# Patient Record
Sex: Female | Born: 1980 | Hispanic: Refuse to answer | Marital: Married | Smoking: Never smoker
Health system: Southern US, Community
[De-identification: ages and names within clinical notes are randomized; demographics above are authoritative.]

## PROBLEM LIST (undated history)

## (undated) DIAGNOSIS — G35 Multiple sclerosis: Secondary | ICD-10-CM

## (undated) DIAGNOSIS — G3781 Myelin oligodendrocyte glycoprotein antibody disease: Secondary | ICD-10-CM

## (undated) HISTORY — PX: OTHER SURGICAL HISTORY: SHX169

## (undated) HISTORY — DX: Myelin oligodendrocyte glycoprotein antibody disease: G37.81

## (undated) HISTORY — DX: Multiple sclerosis: G35

---

## 2020-07-31 ENCOUNTER — Encounter: Payer: Self-pay | Admitting: Neurology

## 2020-08-11 ENCOUNTER — Ambulatory Visit (INDEPENDENT_AMBULATORY_CARE_PROVIDER_SITE_OTHER): Payer: 59 | Admitting: Neurology

## 2020-08-11 ENCOUNTER — Other Ambulatory Visit: Payer: Self-pay

## 2020-08-11 ENCOUNTER — Encounter: Payer: Self-pay | Admitting: Neurology

## 2020-08-11 VITALS — BP 104/72 | HR 112 | Resp 18 | Ht 63.0 in | Wt 129.0 lb

## 2020-08-11 DIAGNOSIS — G43009 Migraine without aura, not intractable, without status migrainosus: Secondary | ICD-10-CM | POA: Diagnosis not present

## 2020-08-11 DIAGNOSIS — G35 Multiple sclerosis: Secondary | ICD-10-CM | POA: Diagnosis not present

## 2020-08-11 NOTE — Patient Instructions (Addendum)
I am glad you have been doing well.  But I would still consider starting a disease modifying therapy medication.  Consider: Ponvory Zeposia Mavenclad Kesimpta (this is an injection but only once a week) Ask your friend his recommendation as well 2.  Will check MRI of brain and cervical spine with and without contrast 3.  Continue vitamin D3 50,000 IU weekly 4.  Follow up in 6 months or sooner if needed.  We have sent a referral to [facility] for your MRI and they will call you directly to schedule your appointment. They are located at [address] [facility]. If you need to contact them directly please call [phone].
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
## 2021-02-16 ENCOUNTER — Ambulatory Visit: Payer: 59 | Admitting: Neurology

## 2021-03-09 ENCOUNTER — Other Ambulatory Visit: Payer: Self-pay | Admitting: Internal Medicine

## 2021-03-09 DIAGNOSIS — G35 Multiple sclerosis: Secondary | ICD-10-CM

## 2021-03-12 ENCOUNTER — Telehealth: Payer: Self-pay | Admitting: Neurology

## 2021-03-12 NOTE — Telephone Encounter (Signed)
ERROR

## 2021-03-13 ENCOUNTER — Other Ambulatory Visit (HOSPITAL_COMMUNITY): Payer: Self-pay | Admitting: Internal Medicine

## 2021-03-13 DIAGNOSIS — G35 Multiple sclerosis: Secondary | ICD-10-CM

## 2021-03-14 ENCOUNTER — Ambulatory Visit (HOSPITAL_COMMUNITY)
Admission: RE | Admit: 2021-03-14 | Discharge: 2021-03-14 | Disposition: A | Discharge status: Home / Self Care | Payer: 59 | Source: Ambulatory Visit | Attending: Internal Medicine | Admitting: Internal Medicine

## 2021-03-14 DIAGNOSIS — G35 Multiple sclerosis: Secondary | ICD-10-CM | POA: Diagnosis present

## 2021-03-14 MED ORDER — GADOBUTROL 1 MMOL/ML IV SOLN
6.0000 mL | Freq: Once | INTRAVENOUS | Status: AC | PRN
  Administered 2021-03-14: 6 mL via INTRAVENOUS

## 2021-03-15 ENCOUNTER — Ambulatory Visit (HOSPITAL_COMMUNITY): Payer: 59

## 2021-03-16 ENCOUNTER — Inpatient Hospital Stay
Admission: RE | Admit: 2021-03-16 | Discharge: 2021-03-16 | Disposition: A | Discharge status: Home / Self Care | Payer: Self-pay | Source: Ambulatory Visit | Attending: Neurology | Admitting: Neurology

## 2021-03-16 ENCOUNTER — Other Ambulatory Visit (HOSPITAL_COMMUNITY): Payer: Self-pay | Admitting: Neurology

## 2021-03-16 DIAGNOSIS — Q232 Congenital mitral stenosis: Secondary | ICD-10-CM

## 2021-03-17 ENCOUNTER — Other Ambulatory Visit: Payer: Self-pay

## 2021-03-17 ENCOUNTER — Inpatient Hospital Stay (HOSPITAL_COMMUNITY)
Admission: EM | Admit: 2021-03-17 | Discharge: 2021-03-22 | DRG: 060 | Disposition: A | Discharge status: Home / Self Care | Payer: 59 | Attending: Family Medicine | Admitting: Family Medicine

## 2021-03-17 DIAGNOSIS — L0293 Carbuncle, unspecified: Secondary | ICD-10-CM | POA: Diagnosis present

## 2021-03-17 DIAGNOSIS — D649 Anemia, unspecified: Secondary | ICD-10-CM | POA: Diagnosis present

## 2021-03-17 DIAGNOSIS — D72829 Elevated white blood cell count, unspecified: Secondary | ICD-10-CM | POA: Diagnosis present

## 2021-03-17 DIAGNOSIS — E559 Vitamin D deficiency, unspecified: Secondary | ICD-10-CM | POA: Diagnosis present

## 2021-03-17 DIAGNOSIS — G43909 Migraine, unspecified, not intractable, without status migrainosus: Secondary | ICD-10-CM | POA: Diagnosis present

## 2021-03-17 DIAGNOSIS — G35 Multiple sclerosis: Principal | ICD-10-CM | POA: Diagnosis present

## 2021-03-17 DIAGNOSIS — S50861A Insect bite (nonvenomous) of right forearm, initial encounter: Secondary | ICD-10-CM | POA: Diagnosis present

## 2021-03-17 DIAGNOSIS — Z20822 Contact with and (suspected) exposure to covid-19: Secondary | ICD-10-CM | POA: Diagnosis present

## 2021-03-17 DIAGNOSIS — Y929 Unspecified place or not applicable: Secondary | ICD-10-CM

## 2021-03-17 DIAGNOSIS — W57XXXA Bitten or stung by nonvenomous insect and other nonvenomous arthropods, initial encounter: Secondary | ICD-10-CM

## 2021-03-17 DIAGNOSIS — H9202 Otalgia, left ear: Secondary | ICD-10-CM | POA: Diagnosis present

## 2021-03-17 LAB — CBC
HCT: 38.7 % (ref 36.0–46.0)
Hemoglobin: 12.6 g/dL (ref 12.0–15.0)
MCH: 30.7 pg (ref 26.0–34.0)
MCHC: 32.6 g/dL (ref 30.0–36.0)
MCV: 94.2 fL (ref 80.0–100.0)
Platelets: 326 10*3/uL (ref 150–400)
RBC: 4.11 MIL/uL (ref 3.87–5.11)
RDW: 13.9 % (ref 11.5–15.5)
WBC: 12.4 10*3/uL — ABNORMAL HIGH (ref 4.0–10.5)
nRBC: 0 % (ref 0.0–0.2)

## 2021-03-17 LAB — URINALYSIS, ROUTINE W REFLEX MICROSCOPIC
Bilirubin Urine: NEGATIVE
Glucose, UA: NEGATIVE mg/dL
Ketones, ur: NEGATIVE mg/dL
Leukocytes,Ua: NEGATIVE
Nitrite: NEGATIVE
Protein, ur: NEGATIVE mg/dL
Specific Gravity, Urine: 1.011 (ref 1.005–1.030)
pH: 7 (ref 5.0–8.0)

## 2021-03-17 LAB — BASIC METABOLIC PANEL WITH GFR
Anion gap: 8 (ref 5–15)
BUN: 10 mg/dL (ref 6–20)
CO2: 26 mmol/L (ref 22–32)
Calcium: 8.9 mg/dL (ref 8.9–10.3)
Chloride: 102 mmol/L (ref 98–111)
Creatinine, Ser: 0.62 mg/dL (ref 0.44–1.00)
GFR, Estimated: >60 mL/min
Glucose, Bld: 101 mg/dL — ABNORMAL HIGH (ref 70–99)
Potassium: 3.7 mmol/L (ref 3.5–5.1)
Sodium: 136 mmol/L (ref 135–145)

## 2021-03-17 LAB — I-STAT BETA HCG BLOOD, ED (MC, WL, AP ONLY): I-stat hCG, quantitative: <5 m[IU]/mL (ref <5)

## 2021-03-17 NOTE — ED Triage Notes (Addendum)
Pt has MS Has not had a flare in 14 years.  Has right sided numbness and tingling.  Had MRI at [facility] and Dr. Leilani Merl called her with results and told her to present to the ED and not wait for her appt that she has tomorrow as she needs treatment sooner.  Pt states she is "dragging her right foot" and cannot grip with her right hand. Finished a prednisone taper on Thursday.

## 2021-03-18 ENCOUNTER — Ambulatory Visit: Payer: 59 | Admitting: Neurology

## 2021-03-18 ENCOUNTER — Observation Stay (HOSPITAL_COMMUNITY): Payer: 59

## 2021-03-18 ENCOUNTER — Encounter (HOSPITAL_COMMUNITY): Payer: Self-pay | Admitting: Internal Medicine

## 2021-03-18 ENCOUNTER — Observation Stay: Payer: Self-pay

## 2021-03-18 DIAGNOSIS — Y929 Unspecified place or not applicable: Secondary | ICD-10-CM | POA: Diagnosis not present

## 2021-03-18 DIAGNOSIS — W57XXXA Bitten or stung by nonvenomous insect and other nonvenomous arthropods, initial encounter: Secondary | ICD-10-CM | POA: Diagnosis not present

## 2021-03-18 DIAGNOSIS — E559 Vitamin D deficiency, unspecified: Secondary | ICD-10-CM | POA: Diagnosis present

## 2021-03-18 DIAGNOSIS — L0293 Carbuncle, unspecified: Secondary | ICD-10-CM | POA: Diagnosis present

## 2021-03-18 DIAGNOSIS — W57XXXD Bitten or stung by nonvenomous insect and other nonvenomous arthropods, subsequent encounter: Secondary | ICD-10-CM | POA: Diagnosis not present

## 2021-03-18 DIAGNOSIS — G35 Multiple sclerosis: Secondary | ICD-10-CM | POA: Diagnosis present

## 2021-03-18 DIAGNOSIS — Z20822 Contact with and (suspected) exposure to covid-19: Secondary | ICD-10-CM | POA: Diagnosis present

## 2021-03-18 DIAGNOSIS — S50861A Insect bite (nonvenomous) of right forearm, initial encounter: Secondary | ICD-10-CM | POA: Diagnosis present

## 2021-03-18 DIAGNOSIS — G43909 Migraine, unspecified, not intractable, without status migrainosus: Secondary | ICD-10-CM | POA: Diagnosis present

## 2021-03-18 DIAGNOSIS — H9202 Otalgia, left ear: Secondary | ICD-10-CM | POA: Diagnosis present

## 2021-03-18 DIAGNOSIS — D649 Anemia, unspecified: Secondary | ICD-10-CM | POA: Diagnosis present

## 2021-03-18 DIAGNOSIS — D72829 Elevated white blood cell count, unspecified: Secondary | ICD-10-CM | POA: Diagnosis present

## 2021-03-18 LAB — BASIC METABOLIC PANEL
Anion gap: 6 (ref 5–15)
BUN: 10 mg/dL (ref 6–20)
CO2: 24 mmol/L (ref 22–32)
Calcium: 8.2 mg/dL — ABNORMAL LOW (ref 8.9–10.3)
Chloride: 106 mmol/L (ref 98–111)
Creatinine, Ser: 0.62 mg/dL (ref 0.44–1.00)
GFR, Estimated: >60 mL/min (ref >60)
Glucose, Bld: 95 mg/dL (ref 70–99)
Potassium: 3.8 mmol/L (ref 3.5–5.1)
Sodium: 136 mmol/L (ref 135–145)

## 2021-03-18 LAB — RESP PANEL BY RT-PCR (FLU A&B, COVID) ARPGX2
Influenza A by PCR: NEGATIVE
Influenza B by PCR: NEGATIVE
SARS Coronavirus 2 by RT PCR: NEGATIVE

## 2021-03-18 LAB — CBG MONITORING, ED: Glucose-Capillary: 100 mg/dL — ABNORMAL HIGH (ref 70–99)

## 2021-03-18 LAB — HIV ANTIBODY (ROUTINE TESTING W REFLEX): HIV Screen 4th Generation wRfx: NONREACTIVE

## 2021-03-18 MED ORDER — SODIUM CHLORIDE 0.9 % IV BOLUS
1000.0000 mL | Freq: Once | INTRAVENOUS | Status: AC
  Administered 2021-03-18: 1000 mL via INTRAVENOUS

## 2021-03-18 MED ORDER — SODIUM CHLORIDE 0.9 % IV SOLN
1000.0000 mg | INTRAVENOUS | Status: AC
  Administered 2021-03-19 – 2021-03-22 (×4): 1000 mg via INTRAVENOUS
  Filled 2021-03-18 (×4): qty 16

## 2021-03-18 MED ORDER — ACETAMINOPHEN 325 MG PO TABS: 650.0000 mg | ORAL_TABLET | Freq: Four times a day (QID) | ORAL | Status: DC | PRN

## 2021-03-18 MED ORDER — ACETAMINOPHEN 650 MG RE SUPP: 650.0000 mg | Freq: Four times a day (QID) | RECTAL | Status: DC | PRN

## 2021-03-18 MED ORDER — ONDANSETRON HCL 4 MG/2ML IJ SOLN: 4.0000 mg | Freq: Four times a day (QID) | INTRAMUSCULAR | Status: DC | PRN

## 2021-03-18 MED ORDER — GADOBUTROL 1 MMOL/ML IV SOLN
6.0000 mL | Freq: Once | INTRAVENOUS | Status: AC | PRN
  Administered 2021-03-18: 6 mL via INTRAVENOUS

## 2021-03-18 MED ORDER — DOXYCYCLINE HYCLATE 100 MG PO TABS: 100.0000 mg | ORAL_TABLET | Freq: Two times a day (BID) | ORAL | Status: DC

## 2021-03-18 MED ORDER — DIPHENHYDRAMINE HCL 25 MG PO CAPS: 25.0000 mg | ORAL_CAPSULE | Freq: Every evening | ORAL | Status: DC | PRN

## 2021-03-18 MED ORDER — ENOXAPARIN SODIUM 40 MG/0.4ML IJ SOSY
40.0000 mg | PREFILLED_SYRINGE | INTRAMUSCULAR | Status: DC
  Administered 2021-03-18 – 2021-03-21 (×4): 40 mg via SUBCUTANEOUS
  Filled 2021-03-18 (×4): qty 0.4

## 2021-03-18 MED ORDER — SODIUM CHLORIDE 0.9 % IV SOLN
1000.0000 mg | Freq: Once | INTRAVENOUS | Status: AC
  Administered 2021-03-18: 1000 mg via INTRAVENOUS
  Filled 2021-03-18: qty 16

## 2021-03-18 MED ORDER — DOXYCYCLINE HYCLATE 100 MG PO TABS
100.0000 mg | ORAL_TABLET | Freq: Two times a day (BID) | ORAL | Status: DC
  Administered 2021-03-18 – 2021-03-21 (×8): 100 mg via ORAL
  Filled 2021-03-18 (×8): qty 1

## 2021-03-18 MED ORDER — ONDANSETRON HCL 4 MG PO TABS: 4.0000 mg | ORAL_TABLET | Freq: Four times a day (QID) | ORAL | Status: DC | PRN

## 2021-03-18 MED ORDER — OXYCODONE-ACETAMINOPHEN 5-325 MG PO TABS
2.0000 | ORAL_TABLET | Freq: Once | ORAL | Status: AC
  Administered 2021-03-18: 2 via ORAL
  Filled 2021-03-18: qty 2

## 2021-03-18 MED ORDER — IBUPROFEN 200 MG PO TABS
600.0000 mg | ORAL_TABLET | Freq: Four times a day (QID) | ORAL | Status: DC | PRN
  Administered 2021-03-19: 16:00:00 600 mg via ORAL
  Filled 2021-03-18: qty 3

## 2021-03-18 NOTE — ED Notes (Signed)
Ambulating with PT

## 2021-03-18 NOTE — H&P (Signed)
History and Physical    Janice Rose QQI:[id number] DOB: 1980-06-19 DOA: 03/17/2021  PCP: Lucianne Lei, MD  Patient coming from: Home  I have personally briefly reviewed patient's old medical records in [facility]  Chief Complaint: MS flare  HPI: Janice Rose is a 40 y.o. female with medical history significant of MS, in remission for 15 years now.  MS history: initial diagnosis 2005, initial treatment with IV solumedrol followed by prednisone taper.  Started on Copaxone.  Relapse around 2007.  Also treated with steroids.  No further relapses since that time.  Copaxone stopped in 2010, off DMT since then.  Pt presents to ED with several weeks of RUE and RLE weakness, numbness, heaviness.  Initially thought this due to bug bite on R forearm which is being treated with doxycycline.  Symptoms progressed, nothing makes better or worse.  Low dose steroids started also not helping as outpt.  Went to Dr. Epimenio Foot with [facility].  He got MRI brain on her 3 days ago which shoed 3cm lesion concerning for MS flare.  ED Course: Pt started on solumedrol.   Review of Systems: As per HPI, otherwise all review of systems negative.  Past Medical History:  Diagnosis Date   MS (multiple sclerosis) (HCC)     Past Surgical History:  Procedure Laterality Date   none       reports that she has never smoked. She has never used smokeless tobacco. She reports current alcohol use. No history on file for drug use.  No Known Allergies  Family History  Problem Relation Age of Onset   Multiple sclerosis Neg Hx      Prior to Admission medications   Medication Sig Start Date End Date Taking? Authorizing Provider  doxycycline (VIBRAMYCIN) 100 MG capsule Take 100 mg by mouth See admin instructions. 2 caps on day 1, then 1 capsule daily x 7 days 03/13/21  Yes [provider]  ibuprofen (ADVIL) 200 MG tablet Take 600 mg by mouth every 6 (six) hours as needed for moderate pain or  headache.   Yes [provider]    Physical Exam: Vitals:   03/17/21 2222 03/18/21 0142 03/18/21 0312 03/18/21 0400  BP: 100/74 104/84 99/69 128/82  Pulse: 94 90 72 81  Resp: 16 16 20 20   Temp:      TempSrc:      SpO2: 97% 99% 100% 100%    Constitutional: NAD, calm, comfortable Eyes: PERRL, lids and conjunctivae normal ENMT: Mucous membranes are moist. Posterior pharynx clear of any exudate or lesions.Normal dentition.  Neck: normal, supple, no masses, no thyromegaly Respiratory: clear to auscultation bilaterally, no wheezing, no crackles. Normal respiratory effort. No accessory muscle use.  Cardiovascular: Regular rate and rhythm, no murmurs / rubs / gallops. No extremity edema. 2+ pedal pulses. No carotid bruits.  Abdomen: no tenderness, no masses palpated. No hepatosplenomegaly. Bowel sounds positive.  Musculoskeletal: no clubbing / cyanosis. No joint deformity upper and lower extremities. Good ROM, no contractures. Normal muscle tone.  Skin: 45mm induration RUE Neurologic: Fine motor impairment of fingers on R.  Strength 5/5 in all 4.  Psychiatric: Normal judgment and insight. Alert and oriented x 3. Normal mood.    Labs on Admission: I have personally reviewed following labs and imaging studies  CBC: Recent Labs  Lab 03/17/21 1610  WBC 12.4*  HGB 12.6  HCT 38.7  MCV 94.2  PLT 326   Basic Metabolic Panel: Recent Labs  Lab 03/17/21 1610  NA 136  K 3.7  CL 102  CO2 26  GLUCOSE 101*  BUN 10  CREATININE 0.62  CALCIUM 8.9   GFR: CrCl cannot be calculated (Unknown ideal weight.). Liver Function Tests: No results for input(s): AST, ALT, ALKPHOS, BILITOT, PROT, ALBUMIN in the last 168 hours. No results for input(s): LIPASE, AMYLASE in the last 168 hours. No results for input(s): AMMONIA in the last 168 hours. Coagulation Profile: No results for input(s): INR, PROTIME in the last 168 hours. Cardiac Enzymes: No results for input(s): CKTOTAL, CKMB,  CKMBINDEX, TROPONINI in the last 168 hours. BNP (last 3 results) No results for input(s): PROBNP in the last 8760 hours. HbA1C: No results for input(s): HGBA1C in the last 72 hours. CBG: No results for input(s): GLUCAP in the last 168 hours. Lipid Profile: No results for input(s): CHOL, HDL, LDLCALC, TRIG, CHOLHDL, LDLDIRECT in the last 72 hours. Thyroid Function Tests: No results for input(s): TSH, T4TOTAL, FREET4, T3FREE, THYROIDAB in the last 72 hours. Anemia Panel: No results for input(s): VITAMINB12, FOLATE, FERRITIN, TIBC, IRON, RETICCTPCT in the last 72 hours. Urine analysis:    Component Value Date/Time   COLORURINE STRAW (A) 03/17/2021 1606   APPEARANCEUR CLEAR 03/17/2021 1606   LABSPEC 1.011 03/17/2021 1606   PHURINE 7.0 03/17/2021 1606   GLUCOSEU NEGATIVE 03/17/2021 1606   HGBUR SMALL (A) 03/17/2021 1606   BILIRUBINUR NEGATIVE 03/17/2021 1606   KETONESUR NEGATIVE 03/17/2021 1606   PROTEINUR NEGATIVE 03/17/2021 1606   NITRITE NEGATIVE 03/17/2021 1606   LEUKOCYTESUR NEGATIVE 03/17/2021 1606    Radiological Exams on Admission: No results found.  EKG: Independently reviewed.  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 Though MRI and history more consistent with MS.  DVT prophylaxis: Lovenox Code Status: Full Family Communication: Family at bedside Disposition Plan: Home after treatment for MS flare Consults called: Neurology Admission status: Place in 68    Justyn Langham M. DO [facility]  How to contact the [facility] Attending or Consulting provider 7A - 7P or covering provider during after hours 7P -7A, for this patient?  Check the care team in [facility] and look for a)  attending/consulting [facility] provider listed and b) the [facility] team listed Log into [email or web address]  Amion Physician Scheduling and messaging for groups and whole hospitals  On call and physician scheduling software for group practices, residents, hospitalists and other medical providers for call, clinic, rotation and shift schedules. OnCall [employer] is a hospital-wide system for scheduling doctors and paging doctors on call. EasyPlot is for scientific plotting and data analysis.  [email or web address]  and use [facility] universal password to access. If you do not have the password, please contact the hospital operator.  Locate the [facility] provider you are looking for under [facility] and page to a number that you can be directly reached. If you still have difficulty reaching the provider, please page the [facility] (Director on Call) for the Hospitalists listed on amion for assistance.  03/18/2021, 4:33 AM

## 2021-03-18 NOTE — ED Provider Notes (Signed)
[facility] EMERGENCY DEPARTMENT Provider Note  CSN: [id number] Arrival date & time: 03/17/21 1505  Chief Complaint(s) Multiple Sclerosis  HPI Janice Rose is a 40 y.o. female with a past medical history listed below including multiple sclerosis who been in remission for over 15 years here for several weeks of right upper and lower extremity numbness and heaviness.  She initially noticed numbness of the right upper extremity with difficulty performing fine motor skills.  She believes it was due to a bug bite on the right forearm which is currently being treated with doxycycline.  The right upper extremity symptoms progressed and she developed right lower extremity heaviness and right facial numbness.  No alleviating or aggravating factors.  Patient establish care with Dr. Epimenio Foot from [facility] neurology and followed up with her primary care doctor who obtain MRI of the brain last week noting a 3 cm lesion concerning for MS flare.  HPI  Past Medical History Past Medical History:  Diagnosis Date   MS (multiple sclerosis) (HCC)    There are no problems to display for this patient.  Home Medication(s) Prior to Admission medications   Medication Sig Start Date End Date Taking? Authorizing Provider  doxycycline (VIBRAMYCIN) 100 MG capsule Take 100 mg by mouth See admin instructions. 2 caps on day 1, then 1 capsule daily x 7 days 03/13/21  Yes [provider]  ibuprofen (ADVIL) 200 MG tablet Take 600 mg by mouth every 6 (six) hours as needed for moderate pain or headache.   Yes [provider]  predniSONE (STERAPRED UNI-PAK 21 TAB) 10 MG (21) TBPK tablet Take 10 mg by mouth See admin instructions. 6,5,4,3,2,1 Patient not taking: Reported on 03/18/2021 03/06/21   [provider]                                                                                                                                    Past Surgical History Past Surgical  History:  Procedure Laterality Date   none     Family History No family history on file.  Social History Social History   Tobacco Use   Smoking status: Never   Smokeless tobacco: Never  Vaping Use   Vaping Use: Never used  Substance Use Topics   Alcohol use: Yes   Allergies Patient has no known allergies.  Review of Systems Review of Systems All other systems are reviewed and are negative for acute change except as noted in the HPI  Physical Exam Vital Signs  I have reviewed the triage vital signs BP 128/82 (BP Location: Right Arm)   Pulse 81   Temp 99.1 F (37.3 C) (Oral)   Resp 20   SpO2 100%   Physical Exam Vitals reviewed.  Constitutional:      General: She is not in acute distress.    Appearance: She is well-developed. She is not diaphoretic.  HENT:  Head: Normocephalic and atraumatic.     Nose: Nose normal.  Eyes:     General: No scleral icterus.       Right eye: No discharge.        Left eye: No discharge.     Conjunctiva/sclera: Conjunctivae normal.     Pupils: Pupils are equal, round, and reactive to light.  Cardiovascular:     Rate and Rhythm: Normal rate and regular rhythm.     Heart sounds: No murmur heard.   No friction rub. No gallop.  Pulmonary:     Effort: Pulmonary effort is normal. No respiratory distress.     Breath sounds: Normal breath sounds. No stridor. No rales.  Abdominal:     General: There is no distension.     Palpations: Abdomen is soft.     Tenderness: There is no abdominal tenderness.  Musculoskeletal:        General: No tenderness.     Cervical back: Normal range of motion and neck supple.  Skin:    General: Skin is warm and dry.     Findings: No rash.       Neurological:     Mental Status: She is alert and oriented to person, place, and time.     Comments: Mental Status:  Alert and oriented to person, place, and time.  Attention and concentration normal.  Speech clear.  Recent memory is intact  Cranial  Nerves:  II Visual Fields: Intact to confrontation. Visual fields intact. III, IV, VI: Pupils equal and reactive to light and near. Full eye movement without nystagmus  V Facial Sensation: Normal. No weakness of masticatory muscles  VII: No facial weakness or asymmetry  VIII Auditory Acuity: Grossly normal  IX/X: The uvula is midline; the palate elevates symmetrically  XI: Normal sternocleidomastoid and trapezius strength  XII: The tongue is midline. No atrophy or fasciculations.   Motor System: Muscle Strength: 5/5 and symmetric in the upper and lower extremities. No pronation or drift.  Muscle Tone: Tone and muscle bulk are normal in the upper and lower extremities.  Reflexes: DTRs: No Clonus Coordination: fast finger touch impaired on right. No tremor.  Sensation: Intact to light touch Gait: deferred     ED Results and Treatments Labs (all labs ordered are listed, but only abnormal results are displayed) Labs Reviewed  BASIC METABOLIC PANEL - Abnormal; Notable for the following components:      Result Value   Glucose, Bld 101 (*)    All other components within normal limits  CBC - Abnormal; Notable for the following components:   WBC 12.4 (*)    All other components within normal limits  URINALYSIS, ROUTINE W REFLEX MICROSCOPIC - Abnormal; Notable for the following components:   Color, Urine STRAW (*)    Hgb urine dipstick SMALL (*)    Bacteria, UA RARE (*)    All other components within normal limits  RESP PANEL BY RT-PCR (FLU A&B, COVID) ARPGX2  I-STAT BETA HCG BLOOD, ED ([facility], [facility], [facility] ONLY)  EKG  EKG Interpretation  Date/Time:    Ventricular Rate:    PR Interval:    QRS Duration:   QT Interval:    QTC Calculation:   R Axis:     Text Interpretation:         Radiology No results found.  Pertinent labs & imaging results that were available  during my care of the patient were reviewed by me and considered in my medical decision making (see MDM for details).  Medications Ordered in ED Medications  sodium chloride 0.9 % bolus 1,000 mL (has no administration in time range)  methylPREDNISolone sodium succinate (SOLU-MEDROL) 1,000 mg in sodium chloride 0.9 % 50 mL IVPB (has no administration in time range)  doxycycline (VIBRA-TABS) tablet 100 mg (has no administration in time range)  oxyCODONE-acetaminophen (PERCOCET/ROXICET) 5-325 MG per tablet 2 tablet (2 tablets Oral Given 03/18/21 0155)                                                                                                                                     Procedures Procedures  (including critical care time)  Medical Decision Making / ED Course I have reviewed the nursing notes for this encounter and the patient's prior records (if available in EHR or on provided paperwork).  Janice Rose was evaluated in Emergency Department on 03/18/2021 for the symptoms described in the history of present illness. She was evaluated in the context of the global COVID-19 pandemic, which necessitated consideration that the patient might be at risk for infection with the SARS-CoV-2 virus that causes COVID-19. Institutional protocols and algorithms that pertain to the evaluation of patients at risk for COVID-19 are in a state of rapid change based on information released by regulatory bodies including the CDC and federal and state organizations. These policies and algorithms were followed during the patient's care in the ED.     MS flare. Just finished low dose steroids. Will need admission for high dose steroids.  Right forearm bug bite. Indurated. <1cm. Will continue Doxy treatment.  Pertinent labs & imaging results that were available during my care of the patient were reviewed by me and considered in my medical decision making:  Neurology consulted who will see. Admit to  medicine.  Final Clinical Impression(s) / ED Diagnoses Final diagnoses:  Multiple sclerosis exacerbation (HCC)  Bug bite with infection, initial encounter     This chart was dictated using voice recognition software.  Despite best efforts to proofread,  errors can occur which can change the documentation meaning.    Nira Conn, MD 03/18/21 [id number]

## 2021-03-18 NOTE — ED Notes (Signed)
Placed in room from MRI

## 2021-03-18 NOTE — Evaluation (Signed)
Physical Therapy Evaluation Patient Details Name: Janice Rose MRN: [id number] DOB: Oct 21, 1980 Today's Date: 03/18/2021  History of Present Illness  40 y/o F presenting on 11/15 after experiencing several weeks RUE and more recent RLE weakness, numbness, and heaviness. Found to have MS flare. PMH includes MS.  Clinical Impression  Pt was seen for mobility on hallway with no AD, and reviewed high level balance testing.  Note her biggest deficits were the control of RLE to land midstance with steps, and the challenge of single leg balance on RLE.  Her plan is to work inpt during the receiving of meds to manage her symptoms of MS, and hopefully will not need further outpatient therapy.  However, plan to order for now pending the completion of her stay given her plan to go home.  Follow acutely for  balance skills, gait training and practice with strengthening for R calf to increase her control of RLE and safety for home.       Recommendations for follow up therapy are one component of a multi-disciplinary discharge planning process, led by the attending physician.  Recommendations may be updated based on patient status, additional functional criteria and insurance authorization.  Follow Up Recommendations Outpatient PT (if needed)    Assistance Recommended at Discharge PRN  Functional Status Assessment Patient has had a recent decline in their functional status and demonstrates the ability to make significant improvements in function in a reasonable and predictable amount of time.  Equipment Recommendations  None recommended by PT    Recommendations for Other Services       Precautions / Restrictions Precautions Precautions: Fall Precaution Comments: RLE mod incoordination Restrictions Weight Bearing Restrictions: No      Mobility  Bed Mobility Overal bed mobility: Independent                  Transfers Overall transfer level: Modified independent                       Ambulation/Gait Ambulation/Gait assistance: Supervision Gait Distance (Feet): 150 Feet Assistive device: None Gait Pattern/deviations: Step-through pattern;Narrow base of support Gait velocity: reduced Gait velocity interpretation: <1.31 ft/sec, indicative of household ambulator Pre-gait activities: standing balance skills General Gait Details: RLE has harder footfall with midstance landing, limited step length on RLE  Stairs            Wheelchair Mobility    Modified Rankin (Stroke Patients Only)       Balance Overall balance assessment: Needs assistance Sitting-balance support: Feet supported Sitting balance-Leahy Scale: Good   Postural control: Right lateral lean Standing balance support: No upper extremity supported Standing balance-Leahy Scale: Fair Standing balance comment: has moments of change in gait but no outright loss with just gait.  Has standing balance test changes                 Standardized Balance Assessment Standardized Balance Assessment : Berg Balance Test Berg Balance Test Sit to Stand: Able to stand without using hands and stabilize independently Standing Unsupported: Able to stand safely 2 minutes Sitting with Back Unsupported but Feet Supported on Floor or Stool: Able to sit safely and securely 2 minutes Stand to Sit: Sits safely with minimal use of hands Transfers: Able to transfer safely, minor use of hands Standing Unsupported with Eyes Closed: Able to stand 10 seconds with supervision Standing Ubsupported with Feet Together: Able to place feet together independently and stand 1 minute safely From Standing, Reach Forward  with Outstretched Arm: Can reach forward >12 cm safely (5") From Standing Position, Pick up Object from Floor: Able to pick up shoe safely and easily From Standing Position, Turn to Look Behind Over each Shoulder: Looks behind from both sides and weight shifts well Turn 360 Degrees: Able to turn 360 degrees  safely in 4 seconds or less Standing Unsupported, Alternately Place Feet on Step/Stool: Able to stand independently and safely and complete 8 steps in 20 seconds Standing Unsupported, One Foot in Front: Able to plae foot ahead of the other independently and hold 30 seconds Standing on One Leg: Tries to lift leg/unable to hold 3 seconds but remains standing independently Total Score: 50         Pertinent Vitals/Pain Pain Assessment: No/denies pain    Home Living Family/patient expects to be discharged to:: Private residence Living Arrangements: Spouse/significant other Available Help at Discharge: Family;Available 24 hours/day Type of Home: Apartment Home Access: Level entry       Home Layout: One level Home Equipment: Agricultural consultant (2 wheels)      Prior Function Prior Level of Function : Independent/Modified Independent;Driving             Mobility Comments: 6 & 40 yo at home       Hand Dominance   Dominant Hand: Right    Extremity/Trunk Assessment   Upper Extremity Assessment Upper Extremity Assessment: Defer to OT evaluation    Lower Extremity Assessment Lower Extremity Assessment: RLE deficits/detail RLE Deficits / Details: weakness in calf and moderate incoordination RLE Coordination: decreased gross motor    Cervical / Trunk Assessment Cervical / Trunk Assessment: Normal  Communication   Communication: No difficulties (reported expressive struggles to OT)  Cognition Arousal/Alertness: Awake/alert Behavior During Therapy: WFL for tasks assessed/performed Overall Cognitive Status: Within Functional Limits for tasks assessed                                          General Comments General comments (skin integrity, edema, etc.): pt has some incoordination on RLE wtih gait changes, mainly noting during single leg stance.  Pt is mainly having issues of coordination but strength is equal on LE's currently    Exercises Other  Exercises Other Exercises: UE and LE coordination   Assessment/Plan    PT Assessment Patient needs continued PT services  PT Problem List Decreased coordination       PT Treatment Interventions DME instruction;Gait training;Stair training;Functional mobility training;Therapeutic activities;Therapeutic exercise;Balance training;Neuromuscular re-education;Patient/family education    PT Goals (Current goals can be found in the Care Plan section)  Acute Rehab PT Goals Patient Stated Goal: to get home and get her tx for MS started PT Goal Formulation: With patient/family Time For Goal Achievement: 03/25/21 Potential to Achieve Goals: Good    Frequency Min 3X/week   Barriers to discharge   has many family members to assist    Co-evaluation               AM-PAC PT "6 Clicks" Mobility  Outcome Measure Help needed turning from your back to your side while in a flat bed without using bedrails?: A Little Help needed moving from lying on your back to sitting on the side of a flat bed without using bedrails?: A Little Help needed moving to and from a bed to a chair (including a wheelchair)?: A Little Help needed standing up from  a chair using your arms (e.g., wheelchair or bedside chair)?: A Little Help needed to walk in hospital room?: A Little Help needed climbing 3-5 steps with a railing? : A Little 6 Click Score: 18    End of Session Equipment Utilized During Treatment: Gait belt Activity Tolerance: Patient tolerated treatment well Patient left: in bed;with family/visitor present;with call bell/phone within reach Nurse Communication: Mobility status PT Visit Diagnosis: Unsteadiness on feet (R26.81);Other abnormalities of gait and mobility (R26.89)    Time: 6967-8938 PT Time Calculation (min) (ACUTE ONLY): 28 min   Charges:   PT Evaluation $PT Eval Moderate Complexity: 1 Mod PT Treatments $Gait Training: 8-22 mins       Ivar Drape 03/18/2021, 5:04 PM  Samul Dada, PT PhD Acute Rehab Dept. Number: [facility] [id number] and [facility] [phone]

## 2021-03-18 NOTE — Evaluation (Signed)
Occupational Therapy Evaluation Patient Details Name: Janice Rose MRN: [id number] DOB: [birth year] Today's Date: 03/18/2021   History of Present Illness Pt is 40 y/o F presenting after experiencing several weeks RUE and RLE weakness, numbness, and heaviness. Found to have MS flare. PMH includes MS.   Clinical Impression   PTA pt lives independently with her husband and 6 & 74 yo children. Pt is able to ambulate and complete ADL tasks, however demonstrates an abnormal gait pattern and deficits with functional use of RUE due to deficits listed below.  Recommend follow up with OT at the neuro outpt center. Will follow acutely.      Recommendations for follow up therapy are one component of a multi-disciplinary discharge planning process, led by the attending physician.  Recommendations may be updated based on patient status, additional functional criteria and insurance authorization.   Follow Up Recommendations  Outpatient OT (neuro outpt)    Assistance Recommended at Discharge None  Functional Status Assessment  Patient has had a recent decline in their functional status and demonstrates the ability to make significant improvements in function in a reasonable and predictable amount of time.  Equipment Recommendations  None recommended by OT    Recommendations for Other Services PT consult     Precautions / Restrictions Precautions Precautions: Fall      Mobility Bed Mobility Overal bed mobility: Independent                  Transfers Overall transfer level: Modified independent    Has been ambulating back and forth to the bathroom; has not had any falls at home                    Balance Overall balance assessment: Mild deficits observed, not formally tested (R foot drags at times); PT to further assess                                         ADL either performed or assessed with clinical judgement   ADL Overall ADL's : Needs  assistance/impaired   Eating/Feeding Details (indicate cue type and reason): using non-dominant hand                                 Functional mobility during ADLs: Modified independent General ADL Comments: Able to complete tasks however difficulty with holding utensils; buttoning, etc     Vision Baseline Vision/History: 0 No visual deficits       Perception     Praxis      Pertinent Vitals/Pain Pain Assessment: No/denies pain     Hand Dominance Right   Extremity/Trunk Assessment Upper Extremity Assessment Upper Extremity Assessment: RUE deficits/detail RUE Deficits / Details: AROM WFL; Strength overall 4/5; decreased in-hand manipulation/fine-motor skills skills; estinguishing use of RUE at times; decreased FNF RUE Sensation: decreased light touch RUE Coordination: decreased fine motor Pt states she was being treated for "carpal tunnel syndrome" by her healthcare provider and had been wearing a brace on her arm; She states she has been "favoring" the arm until she was told that her weakness/sensation deficits were due to her MS   Lower Extremity Assessment Lower Extremity Assessment: Defer to PT evaluation   Cervical / Trunk Assessment Cervical / Trunk Assessment: Normal   Communication Communication Communication: Expressive difficulties (she feels her speech  is different)   Cognition Arousal/Alertness: Awake/alert Behavior During Therapy: WFL for tasks assessed/performed Overall Cognitive Status: Within Functional Limits for tasks assessed                                       General Comments       Exercises Exercises: Other exercises Other Exercises Other Exercises: fine motor/coordination tasks using cups/lids and straws; BUE intergrated tasks   Shoulder Instructions      Home Living Family/patient expects to be discharged to:: Private residence Living Arrangements: Spouse/significant other Available Help at Discharge:  Family;Available 24 hours/day Type of Home: Apartment Home Access: Level entry     Home Layout: One level     Bathroom Shower/Tub: Tub/shower unit;Curtain   Firefighter: Standard Bathroom Accessibility: Yes How Accessible: Accessible via walker Home Equipment: Rolling Walker (2 wheels)          Prior Functioning/Environment Prior Level of Function : Independent/Modified Independent;Driving             Mobility Comments: 6 & 40 yo at home - caretaker for her children          OT Problem List: Decreased strength;Impaired balance (sitting and/or standing);Decreased coordination;Decreased knowledge of use of DME or AE;Impaired sensation;Impaired UE functional use      OT Treatment/Interventions: Self-care/ADL training;Therapeutic exercise;Neuromuscular education;DME and/or AE instruction;Energy conservation;Therapeutic activities;Patient/family education    OT Goals(Current goals can be found in the care plan section) Acute Rehab OT Goals Patient Stated Goal: to get back to normal OT Goal Formulation: With patient Time For Goal Achievement: 04/01/21 Potential to Achieve Goals: Good  OT Frequency: Min 2X/week   Barriers to D/C:            Co-evaluation              AM-PAC OT "6 Clicks" Daily Activity     Outcome Measure Help from another person eating meals?: None Help from another person taking care of personal grooming?: None Help from another person toileting, which includes using toliet, bedpan, or urinal?: None Help from another person bathing (including washing, rinsing, drying)?: None Help from another person to put on and taking off regular upper body clothing?: None Help from another person to put on and taking off regular lower body clothing?: None 6 Click Score: 24   End of Session Nurse Communication: Mobility status;Other (comment) (DC needs)  Activity Tolerance: Patient tolerated treatment well Patient left: in bed;with call bell/phone  within reach;with family/visitor present  OT Visit Diagnosis: Other abnormalities of gait and mobility (R26.89);Hemiplegia and hemiparesis Hemiplegia - Right/Left: Right Hemiplegia - dominant/non-dominant: Dominant Hemiplegia - caused by: Unspecified                Time: 8638-1771 OT Time Calculation (min): 20 min Charges:  OT General Charges $OT Visit: 1 Visit OT Evaluation $OT Eval Moderate Complexity: 1 Mod  Cerena Baine, OT/L   Acute OT Clinical Specialist Acute Rehabilitation Services Pager [phone] Office [phone]   [facility] 03/18/2021, 2:09 PM

## 2021-03-18 NOTE — ED Notes (Signed)
Received verbal report Jonita Albee RN at this time

## 2021-03-18 NOTE — Consult Note (Signed)
NEURO HOSPITALIST CONSULT NOTE   Requesting physician: Dr. Eudelia Bunch  Reason for Consult: MS exacerbation  History obtained from:    Patient and Chart     HPI:                                                                                                                                          Janice Rose is a 40 y.o. female with a known history of multiple sclerosis diagnosed in 2005 who has not had a flare in 14 years, presenting with new onset of right sided numbness and tingling. The new symptoms had been going on for several weeks and included right upper and lower extremity numbness and heaviness. She initially noticed numbness of the right upper extremity with difficulty performing fine motor skills.  She initially believed it was due to a bug bite on the right forearm, which is currently being treated with doxycycline. Her right upper extremity symptoms progressed and she then developed right lower extremity heaviness and right facial numbness. She had an outpatient MRI at [facility] and was then called by her Neurologist, Dr. Epimenio Foot, who informed her of a new enhancing lesion seen on the scan and advised her to proceed to the ED for inpatient steroid treatment. The patient has been dragging her right foot and cannot grip with her right hand. She finished an oral prednisone taper on Thursday; the taper did not result in improvement of her symptoms.    Regarding her prior MS history, she had been on Copaxone, then had a relapse in about 2007 which was treated with steroids. She has had no further relapses since 2007. Copaxone was stopped in 2010 and she has not been on disease modifying treatment since then.   Past Medical History:  Diagnosis Date   MS (multiple sclerosis) (HCC)     Past Surgical History:  Procedure Laterality Date   none      Family History  Problem Relation Age of Onset   Multiple sclerosis Neg Hx             Social History:  reports that  she has never smoked. She has never used smokeless tobacco. She reports current alcohol use. No history on file for drug use.  No Known Allergies  HOME MEDICATIONS:  No current facility-administered medications on file prior to encounter.   Current Outpatient Medications on File Prior to Encounter  Medication Sig Dispense Refill   doxycycline (VIBRAMYCIN) 100 MG capsule Take 100 mg by mouth See admin instructions. 2 caps on day 1, then 1 capsule daily x 7 days     ibuprofen (ADVIL) 200 MG tablet Take 600 mg by mouth every 6 (six) hours as needed for moderate pain or headache.       ROS:                                                                                                                                       As per HPI. Does not endorse any additional neurological complaints.    Blood pressure 128/82, pulse 81, temperature 99.1 F (37.3 C), temperature source Oral, resp. rate 20, height 5\' 3"  (1.6 m), weight 58.5 kg, SpO2 100 %.   General Examination:                                                                                                       Physical Exam  HEENT-  [place]/AT     Lungs- Respirations unlabored Extremities- No edema  Neurological Examination Mental Status: Awake and alert. Pleasant and cooperative. Speech fluent with intact comprehension and naming. Fully oriented. Good insight.  Cranial Nerves: II: Temporal visual fields intact with no extinction to DSS.   III,IV, VI: Tracks and fixates normally. EOMI. No nystagmus.  V,VII: Smile symmetric, facial temp sensation equal bilaterally VIII: Hearing intact to voice IX,X: No hypophonia XI: Subtle lag on the right with shoulder shrug XII: Midline tongue extension Motor: RUE 4+/5. Positive orbiting fingers test on the right.  RLE 4+/5 LUE and LLE 5/5 No pronator drift.  Sensory: Temp and  light touch intact throughout, bilaterally. No asymmetry.  Deep Tendon Reflexes: Mild hyperreflexia on the right.   Plantars: Right: downgoing   Left: downgoing Cerebellar: Subtle ataxia with FNF and toe-tapping on the right.  Gait: Deferred   Lab Results: Basic Metabolic Panel: Recent Labs  Lab 03/17/21 1610  NA 136  K 3.7  CL 102  CO2 26  GLUCOSE 101*  BUN 10  CREATININE 0.62  CALCIUM 8.9    CBC: Recent Labs  Lab 03/17/21 1610  WBC 12.4*  HGB 12.6  HCT 38.7  MCV 94.2  PLT 326    Cardiac Enzymes: No results for input(s): CKTOTAL, CKMB, CKMBINDEX, TROPONINI in the last 168 hours.  Lipid  Panel: No results for input(s): CHOL, TRIG, HDL, CHOLHDL, VLDL, LDLCALC in the last 168 hours.  Imaging: No results found.   Assessment: 40 year old female with known multiple sclerosis, presenting with MS exacerbation 1. Exam reveals mild UMN deficits on the right.  2. MRI brain: Repeat MRI brain is pending.  3. Outside MRI brain: As per HPI.   Recommendations: 1. IV Solumedrol 1000 mg qd x 5 days 2. PT/OT 3. Monitor CBG, WBC and electrolytes while on steroids 4. Most likely will need to restart DMT as outpatient. Copaxone has worked well for her in the past. She had side effects from Rebif. 5. Outpatient follow up with Dr. Epimenio Foot   Electronically signed: Dr. Caryl Pina 03/18/2021, 6:09 AM

## 2021-03-18 NOTE — ED Notes (Signed)
Pt transported to MRI 

## 2021-03-18 NOTE — ED Notes (Signed)
Patient in MRI, has not been placed in room 37.

## 2021-03-18 NOTE — Progress Notes (Signed)
Same day note  Patient seen and examined at bedside.  Patient was admitted to the hospital for MS flare with right upper extremity and lower extremity weakness numbness and heaviness.  At the time of my evaluation, patient complains of decreased fine motor skills.  Physical examination reveals alert awake oriented female at bedside.  Right forearm erythematous rash.  Moves all extremities.  Laboratory data and imaging was reviewed  Assessment and Plan.  Multiple sclerosis flare. Neurology on board.  On high-dose IV steroids at this time.  We will continue.  Had outside MRI of the brain 3 days back which showed possibility of active MS.  Repeat MRI has been ordered this admission.  We will get PT OT evaluation.  Patient follows up with [facility] neurology, Dr. Epimenio Foot as outpatient.  On Copaxone in the past.  Patient has been off DMT patient.  Right forearm bug bite.  On doxycycline from outpatient.  We will continue for the next 7 days.  Spoke with the patient's mother at bedside.  No Charge  Signed,  Tenny Craw, MD [facility]

## 2021-03-19 DIAGNOSIS — S50861A Insect bite (nonvenomous) of right forearm, initial encounter: Secondary | ICD-10-CM

## 2021-03-19 LAB — CBC
HCT: 33.4 % — ABNORMAL LOW (ref 36.0–46.0)
Hemoglobin: 10.8 g/dL — ABNORMAL LOW (ref 12.0–15.0)
MCH: 30.3 pg (ref 26.0–34.0)
MCHC: 32.3 g/dL (ref 30.0–36.0)
MCV: 93.8 fL (ref 80.0–100.0)
Platelets: 279 10*3/uL (ref 150–400)
RBC: 3.56 MIL/uL — ABNORMAL LOW (ref 3.87–5.11)
RDW: 13.8 % (ref 11.5–15.5)
WBC: 16.8 10*3/uL — ABNORMAL HIGH (ref 4.0–10.5)
nRBC: 0 % (ref 0.0–0.2)

## 2021-03-19 LAB — BASIC METABOLIC PANEL
Anion gap: 9 (ref 5–15)
BUN: 6 mg/dL (ref 6–20)
CO2: 21 mmol/L — ABNORMAL LOW (ref 22–32)
Calcium: 9 mg/dL (ref 8.9–10.3)
Chloride: 107 mmol/L (ref 98–111)
Creatinine, Ser: 0.46 mg/dL (ref 0.44–1.00)
GFR, Estimated: >60 mL/min (ref >60)
Glucose, Bld: 171 mg/dL — ABNORMAL HIGH (ref 70–99)
Potassium: 3.7 mmol/L (ref 3.5–5.1)
Sodium: 137 mmol/L (ref 135–145)

## 2021-03-19 LAB — MAGNESIUM: Magnesium: 1.9 mg/dL (ref 1.7–2.4)

## 2021-03-19 NOTE — Plan of Care (Signed)

## 2021-03-19 NOTE — Plan of Care (Signed)
  Problem: Education: Goal: Knowledge of General Education information will improve Description: Including pain rating scale, medication(s)/side effects and non-pharmacologic comfort measures Outcome: Progressing   Problem: Clinical Measurements: Goal: Ability to maintain clinical measurements within normal limits will improve Outcome: Progressing Goal: Diagnostic test results will improve Outcome: Progressing   Problem: Activity: Goal: Risk for activity intolerance will decrease Outcome: Progressing   Problem: Pain Managment: Goal: General experience of comfort will improve Outcome: Progressing   Problem: Safety: Goal: Ability to remain free from injury will improve Outcome: Progressing   

## 2021-03-19 NOTE — Progress Notes (Addendum)
PROGRESS NOTE  Janice Rose LOV:[id number] DOB: August 18, 1980 DOA: 03/17/2021 PCP: Lucianne Lei, MD   LOS: 1 day   Brief narrative:  MICHAELLA Rose is a 40 y.o. female with medical history significant of MS, in remission for 15 years with initial diagnosis in 2005 and relapse in 2007 was on Copaxone in the past which was stopped in 2010.  Patient has been off disease modifying treatment since 2000 and but presented to hospital this time with several weeks of right upper extremity and right lower extremity weakness numbness and heaviness.  Initially this was thought to be a bug bite on the right forearm which was being treated as outpatient by doxycycline.  Symptoms continue to progress all see he went to see Dr. Epimenio Foot with [facility] neurology.  He MRI of the brain was done outpatient which showed 3 cm lesion concerning for MS flare so the patient was referred to the hospital for further evaluation and treatment.  Assessment/Plan:  Principal Problem:   Multiple sclerosis (HCC)   Multiple sclerosis flare. Neurology on board.  On high-dose IV steroids at this time.  Plan is 1 g IV daily for 5 days.   Had outside MRI of the brain 3 days back which showed possibility of active MS.  Repeat MRI of the brain has been ordered this admission.  .  Patient follows up with [facility] neurology, Dr. Epimenio Foot as outpatient.  On Copaxone in the past.  Patient has been off DMT patient.  Patient has been seen by physical therapy who recommended outpatient PT on discharge.  MRI of the cervical spine shows possible patchy signal intensity not obvious in different planes.  We will follow neurology recommendations.   Right forearm bug bite.  On doxycycline from outpatient.  We will continue for total of 7 days.  Leukocytosis likely reactive on steroids.  DVT prophylaxis: enoxaparin (LOVENOX) injection 40 mg Start: 03/18/21 1600  Code Status: Full code  Family Communication: Spoke with the patient's husband and father  at bedside.  Status is: Inpatient  Remains inpatient appropriate because: Need for IV steroids for MS flare   Consultants: Neurology  Procedures: None  Anti-infectives:  Doxycycline 11/16>  Anti-infectives (From admission, onward)    Start     Dose/Rate Route Frequency Ordered Stop   03/18/21 1000  doxycycline (VIBRA-TABS) tablet 100 mg  Status:  Discontinued        100 mg Oral Every 12 hours 03/18/21 0413 03/18/21 0427   03/18/21 1000  doxycycline (VIBRA-TABS) tablet 100 mg  Status:  Discontinued        100 mg Oral Every 12 hours 03/18/21 0427 03/18/21 0449   03/18/21 1000  doxycycline (VIBRA-TABS) tablet 100 mg        100 mg Oral Every 12 hours 03/18/21 0449 03/25/21 0959      Subjective: Today, patient was seen and examined at bedside.  Patient still complains of heaviness on the right side.  No visual blurring  Objective: Vitals:   03/19/21 0811 03/19/21 1137  BP: (!) 95/58 (!) 94/53  Pulse: 81 71  Resp: 14 14  Temp: 98 F (36.7 C) 97.9 F (36.6 C)  SpO2: 99% 99%   No intake or output data in the 24 hours ending 03/19/21 1250 Filed Weights   03/18/21 0425  Weight: 58.5 kg   Body mass index is 22.85 kg/m.   Physical Exam: GENERAL: Patient is alert awake and oriented. Not in obvious distress. HENT: No scleral pallor or icterus. Pupils  equally reactive to light. Oral mucosa is moist NECK: is supple, no gross swelling noted. CHEST: Clear to auscultation. No crackles or wheezes.  Diminished breath sounds bilaterally. CVS: S1 and S2 heard, no murmur. Regular rate and rhythm.  ABDOMEN: Soft, non-tender, bowel sounds are present. EXTREMITIES: No edema. CNS: Cranial nerves are intact. No focal motor deficits. SKIN: warm and dry without rashes.  Data Review: I have personally reviewed the following laboratory data and studies,  CBC: Recent Labs  Lab 03/17/21 1610 03/19/21 0203  WBC 12.4* 16.8*  HGB 12.6 10.8*  HCT 38.7 33.4*  MCV 94.2 93.8  PLT 326  279   Basic Metabolic Panel: Recent Labs  Lab 03/17/21 1610 03/18/21 0513 03/19/21 0203  NA 136 136 137  K 3.7 3.8 3.7  CL 102 106 107  CO2 26 24 21*  GLUCOSE 101* 95 171*  BUN 10 10 6   CREATININE 0.62 0.62 0.46  CALCIUM 8.9 8.2* 9.0  MG  --   --  1.9   Liver Function Tests: No results for input(s): AST, ALT, ALKPHOS, BILITOT, PROT, ALBUMIN in the last 168 hours. No results for input(s): LIPASE, AMYLASE in the last 168 hours. No results for input(s): AMMONIA in the last 168 hours. Cardiac Enzymes: No results for input(s): CKTOTAL, CKMB, CKMBINDEX, TROPONINI in the last 168 hours. BNP (last 3 results) No results for input(s): BNP in the last 8760 hours.  ProBNP (last 3 results) No results for input(s): PROBNP in the last 8760 hours.  CBG: Recent Labs  Lab 03/18/21 0508  GLUCAP 100*   Recent Results (from the past 240 hour(s))  Resp Panel by RT-PCR (Flu A&B, Covid) Nasopharyngeal Swab     Status: None   Collection Time: 03/18/21  4:24 AM   Specimen: Nasopharyngeal Swab; Nasopharyngeal(NP) swabs in vial transport medium  Result Value Ref Range Status   SARS Coronavirus 2 by RT PCR NEGATIVE NEGATIVE Final    Comment: (NOTE) SARS-CoV-2 target nucleic acids are NOT DETECTED.  The SARS-CoV-2 RNA is generally detectable in upper respiratory specimens during the acute phase of infection. The lowest concentration of SARS-CoV-2 viral copies this assay can detect is 138 copies/mL. A negative result does not preclude SARS-Cov-2 infection and should not be used as the sole basis for treatment or other patient management decisions. A negative result may occur with  improper specimen collection/handling, submission of specimen other than nasopharyngeal swab, presence of viral mutation(s) within the areas targeted by this assay, and inadequate number of viral copies(<138 copies/mL). A negative result must be combined with clinical observations, patient history, and  epidemiological information. The expected result is Negative.  Fact Sheet for Patients:  03/20/21  Fact Sheet for Healthcare Providers:  BloggerCourse.com  This test is no t yet approved or cleared by the [place] FDA and  has been authorized for detection and/or diagnosis of SARS-CoV-2 by FDA under an Emergency Use Authorization (EUA). This EUA will remain  in effect (meaning this test can be used) for the duration of the COVID-19 declaration under Section 564(b)(1) of the Act, 21 U.S.C.section 360bbb-3(b)(1), unless the authorization is terminated  or revoked sooner.       Influenza A by PCR NEGATIVE NEGATIVE Final   Influenza B by PCR NEGATIVE NEGATIVE Final    Comment: (NOTE) The Xpert Xpress SARS-CoV-2/FLU/RSV plus assay is intended as an aid in the diagnosis of influenza from Nasopharyngeal swab specimens and should not be used as a sole basis for treatment. Nasal washings and  aspirates are unacceptable for Xpert Xpress SARS-CoV-2/FLU/RSV testing.  Fact Sheet for Patients: BloggerCourse.com  Fact Sheet for Healthcare Providers: SeriousBroker.it  This test is not yet approved or cleared by the [place] FDA and has been authorized for detection and/or diagnosis of SARS-CoV-2 by FDA under an Emergency Use Authorization (EUA). This EUA will remain in effect (meaning this test can be used) for the duration of the COVID-19 declaration under Section 564(b)(1) of the Act, 21 U.S.C. section 360bbb-3(b)(1), unless the authorization is terminated or revoked.  Performed at [facility], [address], [place], [place] [address]      Studies: MR Cervical Spine W or Wo Contrast  Result Date: 03/18/2021 CLINICAL DATA:  Multiple sclerosis, new event EXAM: MRI CERVICAL AND THORACIC SPINE WITHOUT AND WITH CONTRAST TECHNIQUE: Multiplanar and multiecho pulse  sequences of the cervical spine, to include the craniocervical junction and cervicothoracic junction, and the thoracic spine, were obtained without and with intravenous contrast. CONTRAST:  59mL GADAVIST GADOBUTROL 1 MMOL/ML IV SOLN COMPARISON:  None. FINDINGS: MRI CERVICAL SPINE FINDINGS Alignment: No significant listhesis. Vertebrae: Vertebral body heights are maintained. No marrow edema. No suspicious osseous lesion. Cord: Patchy STIR and T2 hyperintensity primarily from C4-C6 on sagittal imaging. This is not definitely corroborated on axial imaging. No abnormal intrathecal enhancement. Posterior Fossa, vertebral arteries, paraspinal tissues: Unremarkable. Disc levels: C2-C3:  No stenosis. C3-C4:  No stenosis. C4-C5:  No stenosis. C5-C6: Left central disc protrusion with endplate osteophytes. Partial effacement of the left ventral subarachnoid space. No significant canal or foraminal stenosis. C6-C7:  No stenosis. C7-T1:  No stenosis. MRI THORACIC SPINE FINDINGS Alignment:  Preserved. Vertebrae: Vertebral body heights are maintained. There is no marrow edema. No suspicious osseous lesion. Cord:  No definite abnormal signal. Paraspinal and other soft tissues: Unremarkable. Disc levels: Intervertebral disc heights and signal are maintained. There is no canal or foraminal stenosis at any level. IMPRESSION: Possible patchy abnormal cord signal from C4-C6 seen on sagittal imaging but not definitely corroborated on axial sequence. No abnormal thoracic cord signal. No abnormal enhancement. Electronically Signed   By: Guadlupe Spanish M.D.   On: 03/18/2021 08:54   MR THORACIC SPINE W WO CONTRAST  Result Date: 03/18/2021 CLINICAL DATA:  Multiple sclerosis, new event EXAM: MRI CERVICAL AND THORACIC SPINE WITHOUT AND WITH CONTRAST TECHNIQUE: Multiplanar and multiecho pulse sequences of the cervical spine, to include the craniocervical junction and cervicothoracic junction, and the thoracic spine, were obtained without and  with intravenous contrast. CONTRAST:  80mL GADAVIST GADOBUTROL 1 MMOL/ML IV SOLN COMPARISON:  None. FINDINGS: MRI CERVICAL SPINE FINDINGS Alignment: No significant listhesis. Vertebrae: Vertebral body heights are maintained. No marrow edema. No suspicious osseous lesion. Cord: Patchy STIR and T2 hyperintensity primarily from C4-C6 on sagittal imaging. This is not definitely corroborated on axial imaging. No abnormal intrathecal enhancement. Posterior Fossa, vertebral arteries, paraspinal tissues: Unremarkable. Disc levels: C2-C3:  No stenosis. C3-C4:  No stenosis. C4-C5:  No stenosis. C5-C6: Left central disc protrusion with endplate osteophytes. Partial effacement of the left ventral subarachnoid space. No significant canal or foraminal stenosis. C6-C7:  No stenosis. C7-T1:  No stenosis. MRI THORACIC SPINE FINDINGS Alignment:  Preserved. Vertebrae: Vertebral body heights are maintained. There is no marrow edema. No suspicious osseous lesion. Cord:  No definite abnormal signal. Paraspinal and other soft tissues: Unremarkable. Disc levels: Intervertebral disc heights and signal are maintained. There is no canal or foraminal stenosis at any level. IMPRESSION: Possible patchy abnormal cord signal from C4-C6 seen  on sagittal imaging but not definitely corroborated on axial sequence. No abnormal thoracic cord signal. No abnormal enhancement. Electronically Signed   By: Guadlupe Spanish M.D.   On: 03/18/2021 08:54   MR OUTSIDE FILMS SPINE  Result Date: 03/18/2021 This examination belongs to an outside facility and is stored here for comparison purposes only.  Contact the originating outside institution for any associated report or interpretation.     Joycelyn Das, MD  [facility] 03/19/2021  If 7PM-7AM, please contact night-coverage

## 2021-03-19 NOTE — Progress Notes (Signed)
Neurology Progress Note  Brief HPI: 40 y.o. female with history of MS since 2005 with most recent flare in 2007 previously on Copaxone that was stopped in 2010 without further DMT who presented to the ED 11/16 for evaluation of several weeks of right sided heaviness and difficulty with fine motor skills on the right. An outpatient MRI was obtained revealing a new enhancing lesion and she was sent to the ED for inpatient steroid treatment by Dr. Epimenio Foot. She has had trouble with dragging her right foot and completed an oral prednisone taper on 11/10 without improvement in her symptoms in addition to doxycycline for a right forearm bug bite.   Subjective: No acute overnight events  Exam: Vitals:   03/18/21 2350 03/19/21 0447  BP: (!) 101/51 (!) 112/55  Pulse: 75 71  Resp: 18 18  Temp: 97.6 F (36.4 C) 97.7 F (36.5 C)  SpO2: 98% 100%   Gen: Awake, laying in bed, in no acute distress Resp: non-labored breathing, no respiratory distress Abd: soft, non-tender, non-distended  Neuro: Mental Status: Awake alert and oriented X 4.  Speech is intact without dysarthria.   Naming, repetition, and fluency are intact without aphasia. Patient follows commands for nursing concerns and neglect. Cranial Nerves: PERRL, EOMI without ptosis, facial sensation is intact and symmetric to light touch, face is symmetric resting and smiling, hearing is intact to voice, shoulder shrug symmetrically, tongue is midline. Motor: 4+/5 right upper and lower extremity strength, 5/5 strength on the left upper and lower extremity.  Tone and bulk are normal.  Sensory:Temperature and light touch intact throughout, bilaterally. No asymmetry.  DTR: Mild hyperreflexia on the right patellae, 2+ left patellae, 2+ and symmetric biceps.  Cerebellar: Subtle difficulty consistent with weakness with FNF and toe-tapping on the right.  Gait: Deferred  Pertinent Labs: CBC    Component Value Date/Time   WBC 16.8 (H) 03/19/2021 0203    RBC 3.56 (L) 03/19/2021 0203   HGB 10.8 (L) 03/19/2021 0203   HCT 33.4 (L) 03/19/2021 0203   PLT 279 03/19/2021 0203   MCV 93.8 03/19/2021 0203   MCH 30.3 03/19/2021 0203   MCHC 32.3 03/19/2021 0203   RDW 13.8 03/19/2021 0203   CMP     Component Value Date/Time   NA 137 03/19/2021 0203   K 3.7 03/19/2021 0203   CL 107 03/19/2021 0203   CO2 21 (L) 03/19/2021 0203   GLUCOSE 171 (H) 03/19/2021 0203   BUN 6 03/19/2021 0203   CREATININE 0.46 03/19/2021 0203   CALCIUM 9.0 03/19/2021 0203   GFRNONAA >60 03/19/2021 0203   Imaging Reviewed:  MRI brain wwo 11/14: 3 cm lesion in the left corona radiata and centrum semiovale with rim enhancement and edema, features most consistent with active demyelination. Recommend follow-up after treatment to exclude neoplasm and other differential considerations.  MRI cervical and thoracic spine wwo 11/16: Possible patchy abnormal cord signal from C4-C6 seen on sagittal imaging but not definitely corroborated on axial sequence. No abnormal thoracic cord signal. No abnormal enhancement.  Assessment: 40 year old female with known multiple sclerosis, presenting with MS exacerbation.  - Exam reveals ongoing mild upper motor neuron deficits on the right. - MRI brain, cervical, and thoracic spine as above.  - Due to lesion identified in the left corona radiata with rim enhancement and edema felt to be most consistent with active demyelination, patient was admitted for pulse dose IV steroids, day 2/5 completed 11/17.  Recommendations: - IV Solumedrol 1000 mg qd for  5 days; day 2/5 completed today; treatment 5/5 to be complete 11/20 - PPI - Continue PT/OT as you are - Monitor CBG, WBC and electrolytes while on steroids - Most likely will need to restart DMT as outpatient. Copaxone has worked well for her in the past. She had side effects from Rebif. - Outpatient follow up with Dr. Lum Keas, AGACNP-BC [facility] [phone]  I have seen the patient and reviewed the above note.  She has a large rim-enhancing lesion on the left.  I have low concern for abscess given that she does not appear toxic, and I would expect her symptoms to be much more severe if that were the etiology of this finding.  Tumor could be a possibility, though I would again expect her symptoms to be more severe.  I think that the most likely explanation for the findings is a tumefactive demyelinating lesion and agree with pulse dose steroids.    She will need to start disease modifying therapy, she had an appoint with Dr. Epimenio Foot, but presented to the emergency department instead.  Ritta Slot, MD [facility] [phone]  If 7pm- 7am, please page neurology on call as listed in AMION.

## 2021-03-20 LAB — BASIC METABOLIC PANEL
Anion gap: 5 (ref 5–15)
BUN: 12 mg/dL (ref 6–20)
CO2: 24 mmol/L (ref 22–32)
Calcium: 8.8 mg/dL — ABNORMAL LOW (ref 8.9–10.3)
Chloride: 108 mmol/L (ref 98–111)
Creatinine, Ser: 0.61 mg/dL (ref 0.44–1.00)
GFR, Estimated: >60 mL/min (ref >60)
Glucose, Bld: 121 mg/dL — ABNORMAL HIGH (ref 70–99)
Potassium: 3.7 mmol/L (ref 3.5–5.1)
Sodium: 137 mmol/L (ref 135–145)

## 2021-03-20 NOTE — Progress Notes (Signed)
Neurology Progress Note  Brief HPI: 40 y.o. female with history of MS since 2005 with most recent flare in 2007 previously on Copaxone that was stopped in 2010 without further DMT who presented to the ED 11/16 for evaluation of several weeks of right sided heaviness and difficulty with fine motor skills on the right. An outpatient MRI was obtained revealing a new enhancing lesion and she was sent to the ED for inpatient steroid treatment by Dr. Epimenio Foot. She has had trouble with dragging her right foot and completed an oral prednisone taper on 11/10 without improvement in her symptoms in addition to doxycycline for a right forearm bug bite.   Subjective: No acute overnight events Patient endorses some minimal improvement in symptoms  Exam: Vitals:   03/20/21 0408 03/20/21 0814  BP: (!) 93/56 98/65  Pulse: 65 73  Resp:  16  Temp:  97.8 F (36.6 C)  SpO2:  100%   Gen: Awake, sitting up at bedside, in no acute distress Resp: non-labored breathing, no respiratory distress Abd: soft, non-tender, non-distended  Neuro: Mental Status: Awake alert and oriented X 4.  Speech is intact without dysarthria.   Naming, repetition, and fluency are intact without aphasia. Patient follows commands for nursing concerns and neglect. Cranial Nerves: PERRL, EOMI without ptosis, face is symmetric resting and smiling, hearing is intact to voice. Motor: 4+/5 right upper and lower extremity strength, 5/5 strength on the left upper and lower extremity.  Tone and bulk are normal.  Sensory: Light touch intact throughout, bilaterally. No asymmetry.  DTR: Mild hyperreflexia on the right patellae, 2+ left patellae, 2+ and symmetric biceps.  Cerebellar: Subtle difficulty consistent with weakness with FNF and toe-tapping on the right, slightly improved. Gait: Deferred  Pertinent Labs: CBC    Component Value Date/Time   WBC 16.8 (H) 03/19/2021 0203   RBC 3.56 (L) 03/19/2021 0203   HGB 10.8 (L) 03/19/2021 0203    HCT 33.4 (L) 03/19/2021 0203   PLT 279 03/19/2021 0203   MCV 93.8 03/19/2021 0203   MCH 30.3 03/19/2021 0203   MCHC 32.3 03/19/2021 0203   RDW 13.8 03/19/2021 0203   CMP     Component Value Date/Time   NA 137 03/20/2021 0303   K 3.7 03/20/2021 0303   CL 108 03/20/2021 0303   CO2 24 03/20/2021 0303   GLUCOSE 121 (H) 03/20/2021 0303   BUN 12 03/20/2021 0303   CREATININE 0.61 03/20/2021 0303   CALCIUM 8.8 (L) 03/20/2021 0303   GFRNONAA >60 03/20/2021 0303   Imaging Reviewed:  No new imaging  Assessment: 40 year old female with known multiple sclerosis, presenting with MS exacerbation.  - Exam reveals ongoing mild upper motor neuron deficits on the right, slightly improved today. - MRI brain with large rim-enhancing lesion on the left with low concern for abscess given that she does not appear toxicand I would expect her symptoms to be much more severe if that were the etiology of this finding. Tumor could be a possibility, though I would again expect her symptoms to be more severe.  I think that the most likely explanation for the findings is a tumefactive demyelinating lesion and agree with pulse dose steroids.   - Due to lesion identified in the left corona radiata with rim enhancement and edema felt to be most consistent with active demyelination, patient was admitted for pulse dose IV steroids, day 2/5 completed 11/17.  Recommendations: - IV Solumedrol 1000 mg qd for 5 days; day 3/5 completed today;  treatment 5/5 to be complete 11/20 - PPI - Continue PT/OT as you are - Monitor CBG, WBC and electrolytes while on steroids - Will need to restart DMT as outpatient with follow up with Dr. Epimenio Foot. Copaxone has worked well for her in the past. She had side effects from Rebif.  Lanae Boast, AGACNP-BC [facility] [phone]

## 2021-03-20 NOTE — Progress Notes (Addendum)
Physical Therapy Treatment Patient Details Name: Janice Rose MRN: [id number] DOB: 30-Dec-1980 Today's Date: 03/20/2021   History of Present Illness 40 y/o F presenting on 11/15 after experiencing several weeks RUE and more recent RLE weakness, numbness, and heaviness. Found to have MS flare. PMH includes MS.    PT Comments    Pt received in supine and agreeable to therapy session. Pt very motivated to improve strength and is also receptive to energy conservation and activity pacing techniques. Emphasis on taking rest breaks when modified RPE (fatigue) reaches 6+/10 and asking for help from family members when needed. Pt provided with handout for energy conservation at home and HEP to strengthen LE and UE. Continue to recommend outpatient PT upon DC to improve strength and functional independence. Pt continues to benefit from PT services to progress toward functional mobility goals.      Recommendations for follow up therapy are one component of a multi-disciplinary discharge planning process, led by the attending physician.  Recommendations may be updated based on patient status, additional functional criteria and insurance authorization.  Follow Up Recommendations  Outpatient PT (if needed)     Assistance Recommended at Discharge PRN  Equipment Recommendations  None recommended by PT    Recommendations for Other Services       Precautions / Restrictions Precautions Precautions: Fall Precaution Comments: RLE mod incoordination Restrictions Weight Bearing Restrictions: No     Mobility  Bed Mobility Overal bed mobility: Independent   Transfers Overall transfer level: Modified independent   Ambulation/Gait Ambulation/Gait assistance: Supervision Gait Distance (Feet): 100 Feet (100 ft, seated rest break, 250 ft) Assistive device: None Gait Pattern/deviations: Step-through pattern;Narrow base of support Gait velocity: reduced    General Gait Details: Pt encouraged to wear  shoes that cover the heel, slip on shoes today preventing good heel strike  Stairs Stairs: Yes Stairs assistance: Supervision Stair Management: No rails;Alternating pattern Number of Stairs: 5 General stair comments: No physical assist needed, supervision provided for safety    Balance Overall balance assessment: Needs assistance Sitting-balance support: Feet supported Sitting balance-Leahy Scale: Good   Postural control: Right lateral lean Standing balance support: No upper extremity supported Standing balance-Leahy Scale: Fair Standing balance comment: Pt displays moments of change in gait but no overt LOB during session   Standardized Balance Assessment Standardized Balance Assessment : Dynamic Gait Index   Dynamic Gait Index Level Surface: Normal Change in Gait Speed: Normal Gait with Horizontal Head Turns: Normal Gait with Vertical Head Turns: Normal Gait and Pivot Turn: Normal Step Over Obstacle: Mild Impairment Step Around Obstacles: Normal Steps: Normal Total Score: 23     Cognition Arousal/Alertness: Awake/alert Behavior During Therapy: WFL for tasks assessed/performed Overall Cognitive Status: Within Functional Limits for tasks assessed     Exercises Other Exercises Other Exercises: handout for RUE fine motor coordination provided as well as red tubing for utensil use at home for bouts of increased fatigue    General Comments General comments (skin integrity, edema, etc.): Pt encouraged to pace activity and conserve energy with activities, given handout on energy conservation and HEP for LE and UE strengthening [email or web address]    Access Code: [email or web address]; modified RPE 8/10 at end of session (pt stated she felt more tired as she had just recently worked with OT as well, encouraged to rest once RPE reaches 6/10)      Pertinent Vitals/Pain Pain Assessment: No/denies pain     PT Goals (current goals can now be found in the care plan section)  Acute  Rehab PT Goals Patient Stated Goal: to get home and get her tx for MS started PT Goal Formulation: With patient/family Time For Goal Achievement: 03/25/21 Progress towards PT goals: Progressing toward goals    Frequency    Min 3X/week      PT Plan Current plan remains appropriate       AM-PAC PT "6 Clicks" Mobility   Outcome Measure  Help needed turning from your back to your side while in a flat bed without using bedrails?: None Help needed moving from lying on your back to sitting on the side of a flat bed without using bedrails?: None Help needed moving to and from a bed to a chair (including a wheelchair)?: None Help needed standing up from a chair using your arms (e.g., wheelchair or bedside chair)?: None Help needed to walk in hospital room?: A Little Help needed climbing 3-5 steps with a railing? : A Little 6 Click Score: 22    End of Session Equipment Utilized During Treatment: Gait belt Activity Tolerance: Patient tolerated treatment well Patient left: in bed;with family/visitor present;with call bell/phone within reach (Pts mom in room) Nurse Communication: Mobility status PT Visit Diagnosis: Unsteadiness on feet (R26.81);Other abnormalities of gait and mobility (R26.89)     Time: 1049-1106 PT Time Calculation (min) (ACUTE ONLY): 17 min  Charges:  $Gait Training: 8-22 mins                     Harland German, Student PTA CI: Carly P., PTA  Carly M Poff 03/20/2021, 11:48 AM

## 2021-03-20 NOTE — Progress Notes (Signed)
Occupational Therapy Treatment Patient Details Name: Janice Rose MRN: [id number] DOB: 05-29-1980 Today's Date: 03/20/2021   History of present illness 40 y/o F presenting on 11/15 after experiencing several weeks RUE and more recent RLE weakness, numbness, and heaviness. Found to have MS flare. PMH includes MS.   OT comments  Patient continues to make steady progress towards goals in skilled OT session. Patient's session encompassed  [facility] handout for RUE as well as providing red built-up tubing for utensils. Patient reporting increased ability to use RUE for self feeding and brushing teeth, however remains uncoordinated in attempts. Therapist providing the suggestion of using red tubing when patient feels more fatigued (writing or cooking utensils) but encouraging continued use of RUE in order to increase precision. Patient also reporting decreased sensation in R fingers, "feeling like Ive left them out in the snow". Education provided to patient with regard to sensation by testing temperatures first with L hand to prevent further injury. Patient also educated to use varying textures against R fingers to promote increased sensation. Patient asking for exercises for RLE, which therapist deferred to PT at this time. Therapist did highlight importance of removing clutter from hallways and throw rugs to prevent falls. Discharge remains appropriate at this time; therapy will continue to follow while in house.    Recommendations for follow up therapy are one component of a multi-disciplinary discharge planning process, led by the attending physician.  Recommendations may be updated based on patient status, additional functional criteria and insurance authorization.    Follow Up Recommendations  Outpatient OT (neuro outpt)    Assistance Recommended at Discharge None  Equipment Recommendations  None recommended by OT    Recommendations for Other Services      Precautions / Restrictions  Precautions Precautions: Fall Precaution Comments: RLE mod incoordination Restrictions Weight Bearing Restrictions: No       Mobility Bed Mobility Overal bed mobility: Independent                  Transfers Overall transfer level: Independent                       Balance Overall balance assessment: Needs assistance Sitting-balance support: Feet supported Sitting balance-Leahy Scale: Good                                     ADL either performed or assessed with clinical judgement   ADL Overall ADL's : Needs assistance/impaired   Eating/Feeding Details (indicate cue type and reason): using non-dominant hand                                 Functional mobility during ADLs: Modified independent General ADL Comments: Patient is slowly incorporating RUE into tasks, but continues to report difficulty with [facility] task, handout and red tubing provided in session    Extremity/Trunk Assessment              Vision       Perception     Praxis      Cognition Arousal/Alertness: Awake/alert Behavior During Therapy: WFL for tasks assessed/performed Overall Cognitive Status: Within Functional Limits for tasks assessed  Exercises Other Exercises Other Exercises: handout for RUE fine motor coordination provided as well as red tubing for utensil use at home for bouts of increased fatigue   Shoulder Instructions       General Comments      Pertinent Vitals/ Pain       Pain Assessment: No/denies pain  Home Living                                          Prior Functioning/Environment              Frequency  Min 2X/week        Progress Toward Goals  OT Goals(current goals can now be found in the care plan section)  Progress towards OT goals: Progressing toward goals  Acute Rehab OT Goals Patient Stated Goal: to work on my right  hand OT Goal Formulation: With patient Time For Goal Achievement: 04/01/21 Potential to Achieve Goals: Good  Plan Discharge plan remains appropriate    Co-evaluation                 AM-PAC OT "6 Clicks" Daily Activity     Outcome Measure   Help from another person eating meals?: None Help from another person taking care of personal grooming?: None Help from another person toileting, which includes using toliet, bedpan, or urinal?: None Help from another person bathing (including washing, rinsing, drying)?: None Help from another person to put on and taking off regular upper body clothing?: None Help from another person to put on and taking off regular lower body clothing?: None 6 Click Score: 24    End of Session Equipment Utilized During Treatment: Other (comment) (handout and red built-up tubing)  OT Visit Diagnosis: Other abnormalities of gait and mobility (R26.89);Hemiplegia and hemiparesis Hemiplegia - Right/Left: Right Hemiplegia - dominant/non-dominant: Dominant Hemiplegia - caused by: Unspecified   Activity Tolerance Patient tolerated treatment well   Patient Left in bed;with call bell/phone within reach;with family/visitor present (sitting EOB)   Nurse Communication Mobility status        Time: 5498-2641 OT Time Calculation (min): 21 min  Charges: OT General Charges $OT Visit: 1 Visit OT Treatments $Therapeutic Activity: 8-22 mins  Pollyann Glen E. Terez Freimark, COTA/L Acute Rehabilitation Services [phone]   Cherlyn Cushing 03/20/2021, 10:30 AM

## 2021-03-20 NOTE — TOC Initial Note (Signed)
Transition of Care [facility]) - Initial/Assessment Note    Patient Details  Name: Janice Rose MRN: [id number] Date of Birth: February 04, 1981  [facility]) CM/SW Contact:    Kermit Balo, RN Phone Number: 03/20/2021, 10:43 AM  Clinical Narrative:                 Patient is from home with spouse and family. Current recommendation are for outpatient therapy. Pt would like to attend [facility] if still needs outpatient rehab at d/c.  No DME needs.  Pt denies issues with transportation or home medications.  Pt has transport home when medically ready.   Expected Discharge Plan: OP Rehab Barriers to Discharge: Continued Medical Work up   Patient Goals and CMS Choice     Choice offered to / list presented to : Patient  Expected Discharge Plan and Services Expected Discharge Plan: OP Rehab   Discharge Planning Services: CM Consult   Living arrangements for the past 2 months: Single Family Home                                      Prior Living Arrangements/Services Living arrangements for the past 2 months: Single Family Home Lives with:: Spouse, Minor Children Patient language and need for interpreter reviewed:: Yes Do you feel safe going back to the place where you live?: Yes        Care giver support system in place?: Yes (comment)   Criminal Activity/Legal Involvement Pertinent to Current Situation/Hospitalization: No - Comment as needed  Activities of Daily Living      Permission Sought/Granted                  Emotional Assessment Appearance:: Appears stated age Attitude/Demeanor/Rapport: Engaged Affect (typically observed): Accepting Orientation: : Oriented to Self, Oriented to Place, Oriented to  Time, Oriented to Situation   Psych Involvement: No (comment)  Admission diagnosis:  Multiple sclerosis (HCC) [G35] Multiple sclerosis exacerbation (HCC) [G35] Bug bite with infection, initial encounter [V78.XXXA] Patient Active  Problem List   Diagnosis Date Noted   Multiple sclerosis (HCC) 03/18/2021   PCP:  Lucianne Lei, MD Pharmacy:   [facility] [phone] - [place], [place] - [address] [place] [address] Phone: [phone] Fax: [phone]     Social Determinants of Health (SDOH) Interventions    Readmission Risk Interventions No flowsheet data found.

## 2021-03-20 NOTE — Progress Notes (Signed)
PROGRESS NOTE  Janice Rose JQZ:[id number] DOB: 1980-06-17 DOA: 03/17/2021 PCP: Lucianne Lei, MD   LOS: 2 days   Brief narrative:  Janice Rose is a 40 y.o. female with medical history significant of MS, in remission for 15 years (with initial diagnosis in 2005 and relapse in 2007on Copaxone in the past which was stopped in 2010),off disease modifying treatment since 2000  presented to hospital this time with several weeks of right upper extremity and right lower extremity weakness numbness and heaviness.  Initially .this was thought to be a bug bite on the right forearm which was being treated as outpatient by doxycycline.  Symptoms continued to progress so she went to see Dr. Epimenio Foot with [facility] neurology.  MRI of the brain was done outpatient which showed 3 cm lesion concerning for MS flare, so the patient was referred to the hospital for further evaluation and treatment.  Assessment/Plan:  Principal Problem:   Multiple sclerosis (HCC)  Multiple sclerosis flare. Neurology on board.  On high-dose IV steroids at this time.  Plan is 1 g IV daily for 5 days.   Day 3/5 today. Had outside MRI of the brain which showed possibility of active MS.   Patient follows up with [facility] neurology, Dr. Epimenio Foot as outpatient.  Patient has been seen by physical therapy who recommended outpatient PT on discharge.  MRI of the cervical spine shows possible patchy signal intensity not obvious in different planes.    Right forearm bug bite.  On doxycycline from outpatient.  We will continue for total of 7 days.  Leukocytosis likely reactive on steroids.  Latest WBC at 16.8.  DVT prophylaxis: enoxaparin (LOVENOX) injection 40 mg Start: 03/18/21 1600  Code Status: Full code  Family Communication:  Spoke with the patient at bedside.  Patient's mom present as well.  Status is: Inpatient  Remains inpatient appropriate because: Need for IV steroids for MS  flare   Consultants: Neurology  Procedures: None  Anti-infectives:  Doxycycline 11/16>  Anti-infectives (From admission, onward)    Start     Dose/Rate Route Frequency Ordered Stop   03/18/21 1000  doxycycline (VIBRA-TABS) tablet 100 mg  Status:  Discontinued        100 mg Oral Every 12 hours 03/18/21 0413 03/18/21 0427   03/18/21 1000  doxycycline (VIBRA-TABS) tablet 100 mg  Status:  Discontinued        100 mg Oral Every 12 hours 03/18/21 0427 03/18/21 0449   03/18/21 1000  doxycycline (VIBRA-TABS) tablet 100 mg        100 mg Oral Every 12 hours 03/18/21 0449 03/25/21 0959      Subjective: Today, patient was seen and examined bedside.  Was able to sleep okay yesterday.  Complains of mild left eye heaviness.  Asking for physical therapy.    Objective: Vitals:   03/20/21 0408 03/20/21 0814  BP: (!) 93/56 98/65  Pulse: 65 73  Resp:  16  Temp:  97.8 F (36.6 C)  SpO2:  100%    Intake/Output Summary (Last 24 hours) at 03/20/2021 1024 Last data filed at 03/20/2021 0644 Gross per 24 hour  Intake 372 ml  Output --  Net 372 ml   Filed Weights   03/18/21 0425  Weight: 58.5 kg   Body mass index is 22.85 kg/m.   Physical Exam:  GENERAL: Patient is alert awake and oriented. Not in obvious distress. HENT: No scleral pallor or icterus. Pupils equally reactive to light. Oral mucosa is moist NECK:  is supple, no gross swelling noted. CHEST: Clear to auscultation. No crackles or wheezes.  Diminished breath sounds bilaterally. CVS: S1 and S2 heard, no murmur. Regular rate and rhythm.  ABDOMEN: Soft, non-tender, bowel sounds are present. EXTREMITIES: No edema. CNS: Cranial nerves are intact.  Moves all extremities. SKIN: warm and dry without rashes.  Data Review: I have personally reviewed the following laboratory data and studies,  CBC: Recent Labs  Lab 03/17/21 1610 03/19/21 0203  WBC 12.4* 16.8*  HGB 12.6 10.8*  HCT 38.7 33.4*  MCV 94.2 93.8  PLT 326 279     Basic Metabolic Panel: Recent Labs  Lab 03/17/21 1610 03/18/21 0513 03/19/21 0203 03/20/21 0303  NA 136 136 137 137  K 3.7 3.8 3.7 3.7  CL 102 106 107 108  CO2 26 24 21* 24  GLUCOSE 101* 95 171* 121*  BUN 10 10 6 12   CREATININE 0.62 0.62 0.46 0.61  CALCIUM 8.9 8.2* 9.0 8.8*  MG  --   --  1.9  --     Liver Function Tests: No results for input(s): AST, ALT, ALKPHOS, BILITOT, PROT, ALBUMIN in the last 168 hours. No results for input(s): LIPASE, AMYLASE in the last 168 hours. No results for input(s): AMMONIA in the last 168 hours. Cardiac Enzymes: No results for input(s): CKTOTAL, CKMB, CKMBINDEX, TROPONINI in the last 168 hours. BNP (last 3 results) No results for input(s): BNP in the last 8760 hours.  ProBNP (last 3 results) No results for input(s): PROBNP in the last 8760 hours.  CBG: Recent Labs  Lab 03/18/21 0508  GLUCAP 100*    Recent Results (from the past 240 hour(s))  Resp Panel by RT-PCR (Flu A&B, Covid) Nasopharyngeal Swab     Status: None   Collection Time: 03/18/21  4:24 AM   Specimen: Nasopharyngeal Swab; Nasopharyngeal(NP) swabs in vial transport medium  Result Value Ref Range Status   SARS Coronavirus 2 by RT PCR NEGATIVE NEGATIVE Final    Comment: (NOTE) SARS-CoV-2 target nucleic acids are NOT DETECTED.  The SARS-CoV-2 RNA is generally detectable in upper respiratory specimens during the acute phase of infection. The lowest concentration of SARS-CoV-2 viral copies this assay can detect is 138 copies/mL. A negative result does not preclude SARS-Cov-2 infection and should not be used as the sole basis for treatment or other patient management decisions. A negative result may occur with  improper specimen collection/handling, submission of specimen other than nasopharyngeal swab, presence of viral mutation(s) within the areas targeted by this assay, and inadequate number of viral copies(<138 copies/mL). A negative result must be combined  with clinical observations, patient history, and epidemiological information. The expected result is Negative.  Fact Sheet for Patients:  03/20/21  Fact Sheet for Healthcare Providers:  BloggerCourse.com  This test is no t yet approved or cleared by the [place] FDA and  has been authorized for detection and/or diagnosis of SARS-CoV-2 by FDA under an Emergency Use Authorization (EUA). This EUA will remain  in effect (meaning this test can be used) for the duration of the COVID-19 declaration under Section 564(b)(1) of the Act, 21 U.S.C.section 360bbb-3(b)(1), unless the authorization is terminated  or revoked sooner.       Influenza A by PCR NEGATIVE NEGATIVE Final   Influenza B by PCR NEGATIVE NEGATIVE Final    Comment: (NOTE) The Xpert Xpress SARS-CoV-2/FLU/RSV plus assay is intended as an aid in the diagnosis of influenza from Nasopharyngeal swab specimens and should not be used as a  sole basis for treatment. Nasal washings and aspirates are unacceptable for Xpert Xpress SARS-CoV-2/FLU/RSV testing.  Fact Sheet for Patients: BloggerCourse.com  Fact Sheet for Healthcare Providers: SeriousBroker.it  This test is not yet approved or cleared by the [place] FDA and has been authorized for detection and/or diagnosis of SARS-CoV-2 by FDA under an Emergency Use Authorization (EUA). This EUA will remain in effect (meaning this test can be used) for the duration of the COVID-19 declaration under Section 564(b)(1) of the Act, 21 U.S.C. section 360bbb-3(b)(1), unless the authorization is terminated or revoked.  Performed at [facility], [address] [place], [place] [address]       Studies: No results found.   Joycelyn Das, MD  [facility] 03/20/2021  If 7PM-7AM, please contact night-coverage

## 2021-03-21 ENCOUNTER — Ambulatory Visit (HOSPITAL_COMMUNITY): Admission: RE | Admit: 2021-03-21 | Payer: 59 | Source: Ambulatory Visit

## 2021-03-21 DIAGNOSIS — D649 Anemia, unspecified: Secondary | ICD-10-CM

## 2021-03-21 DIAGNOSIS — L0293 Carbuncle, unspecified: Secondary | ICD-10-CM

## 2021-03-21 DIAGNOSIS — H9202 Otalgia, left ear: Secondary | ICD-10-CM

## 2021-03-21 LAB — MAGNESIUM: Magnesium: 1.8 mg/dL (ref 1.7–2.4)

## 2021-03-21 LAB — CBC
HCT: 28.7 % — ABNORMAL LOW (ref 36.0–46.0)
Hemoglobin: 9.4 g/dL — ABNORMAL LOW (ref 12.0–15.0)
MCH: 30.6 pg (ref 26.0–34.0)
MCHC: 32.8 g/dL (ref 30.0–36.0)
MCV: 93.5 fL (ref 80.0–100.0)
Platelets: 249 10*3/uL (ref 150–400)
RBC: 3.07 MIL/uL — ABNORMAL LOW (ref 3.87–5.11)
RDW: 14.2 % (ref 11.5–15.5)
WBC: 13.2 10*3/uL — ABNORMAL HIGH (ref 4.0–10.5)
nRBC: 0 % (ref 0.0–0.2)

## 2021-03-21 LAB — BASIC METABOLIC PANEL
Anion gap: 8 (ref 5–15)
BUN: 10 mg/dL (ref 6–20)
CO2: 24 mmol/L (ref 22–32)
Calcium: 8.4 mg/dL — ABNORMAL LOW (ref 8.9–10.3)
Chloride: 106 mmol/L (ref 98–111)
Creatinine, Ser: 0.45 mg/dL (ref 0.44–1.00)
GFR, Estimated: >60 mL/min (ref >60)
Glucose, Bld: 115 mg/dL — ABNORMAL HIGH (ref 70–99)
Potassium: 3.9 mmol/L (ref 3.5–5.1)
Sodium: 138 mmol/L (ref 135–145)

## 2021-03-21 MED ORDER — PROCHLORPERAZINE EDISYLATE 10 MG/2ML IJ SOLN
10.0000 mg | Freq: Once | INTRAMUSCULAR | Status: AC
  Administered 2021-03-21: 10 mg via INTRAVENOUS
  Filled 2021-03-21: qty 2

## 2021-03-21 MED ORDER — DIPHENHYDRAMINE HCL 12.5 MG/5ML PO ELIX
12.5000 mg | ORAL_SOLUTION | Freq: Once | ORAL | Status: AC
  Administered 2021-03-21: 12.5 mg via ORAL
  Filled 2021-03-21 (×2): qty 5

## 2021-03-21 MED ORDER — PANTOPRAZOLE SODIUM 40 MG PO TBEC
40.0000 mg | DELAYED_RELEASE_TABLET | Freq: Every day | ORAL | Status: DC
  Administered 2021-03-21 – 2021-03-22 (×2): 40 mg via ORAL
  Filled 2021-03-21 (×2): qty 1

## 2021-03-21 NOTE — Progress Notes (Signed)
Patient is awake, alert, interactive and appropriate.  She reports some improvement on the right side.  She has some pain around her left eye, and centered in her left ear.  She reports occasional sharp lancinating pains with a feeling of pressure/heaviness around her eye.  This is in a similar location to her typical migraines, but she is not photophobic at the current time.  I suspect this may represent migraine, and would favor attempting treatment with a migraine cocktail.  Internal medicine to perform auricular exam.  She will be getting her fifth dose of Solu-Medrol tomorrow, okay to discharge following this with a 5-day prednisone taper, 50->40->30->20->10mg .  I will start Protonix, and would continue this through when she is done with the oral steroids.  Ritta Slot, MD [facility] [phone]  If 7pm- 7am, please page neurology on call as listed in AMION.

## 2021-03-21 NOTE — Assessment & Plan Note (Signed)
New since admission.   Likely artifactual given no clnical bleeding. - Check Tbili tomorrow

## 2021-03-21 NOTE — Assessment & Plan Note (Signed)
Continue doxycycline

## 2021-03-21 NOTE — Progress Notes (Signed)
Physical Therapy Treatment Patient Details Name: BRANTLEE HINDE MRN: [id number] DOB: [birth year] Today's Date: 03/21/2021   History of Present Illness 40 y/o F presenting on 11/15 after experiencing several weeks RUE and more recent RLE weakness, numbness, and heaviness. Found to have MS flare. PMH includes MS.    PT Comments    Pt is progressing well towards goals. She reports MS symptoms are improving but feels a slight heaviness in her LEs and R UE. Noted difficulty with heel strike on R LE during gait, but overall appears steady with no overt LOB. Given heel cord stretch to assist with stiffness on R LE and advised to get up and ambulate later with family.    Recommendations for follow up therapy are one component of a multi-disciplinary discharge planning process, led by the attending physician.  Recommendations may be updated based on patient status, additional functional criteria and insurance authorization.  Follow Up Recommendations  Outpatient PT (if needed)     Assistance Recommended at Discharge PRN  Equipment Recommendations  None recommended by PT    Recommendations for Other Services       Precautions / Restrictions Precautions Precautions: Fall Precaution Comments: RLE mod incoordination     Mobility  Bed Mobility Overal bed mobility: Independent                  Transfers Overall transfer level: Modified independent                      Ambulation/Gait Ambulation/Gait assistance: Supervision Gait Distance (Feet): 550 Feet Assistive device: None Gait Pattern/deviations: Step-through pattern;Narrow base of support Gait velocity: reduced     General Gait Details: Pt with c/o stiffness in R calf. Continues to be limited on  heel strike despite shoe change. Pt lacking reciprocal arm swing on the R. Corrects briefly with cueing, but returns to guarded position. Performed head turns and nods with no overt LOB.   Stairs              Wheelchair Mobility    Modified Rankin (Stroke Patients Only)       Balance Overall balance assessment: Needs assistance Sitting-balance support: Feet supported Sitting balance-Leahy Scale: Good     Standing balance support: No upper extremity supported Standing balance-Leahy Scale: Fair Standing balance comment: Pt displays moments of change in gait but no overt LOB during session                            Cognition Arousal/Alertness: Awake/alert Behavior During Therapy: WFL for tasks assessed/performed Overall Cognitive Status: Within Functional Limits for tasks assessed                                          Exercises      General Comments General comments (skin integrity, edema, etc.): pt reports no questions or concerns about HEP given yesterday.      Pertinent Vitals/Pain Pain Assessment: No/denies pain    Home Living                          Prior Function            PT Goals (current goals can now be found in the care plan section) Acute Rehab PT Goals Patient Stated Goal: to get home  and get her tx for MS started PT Goal Formulation: With patient/family Time For Goal Achievement: 03/25/21 Potential to Achieve Goals: Good Progress towards PT goals: Progressing toward goals    Frequency    Min 3X/week      PT Plan Current plan remains appropriate    Co-evaluation              AM-PAC PT "6 Clicks" Mobility   Outcome Measure  Help needed turning from your back to your side while in a flat bed without using bedrails?: None Help needed moving from lying on your back to sitting on the side of a flat bed without using bedrails?: None Help needed moving to and from a bed to a chair (including a wheelchair)?: None Help needed standing up from a chair using your arms (e.g., wheelchair or bedside chair)?: None Help needed to walk in hospital room?: A Little Help needed climbing 3-5 steps with a  railing? : A Little 6 Click Score: 22    End of Session Equipment Utilized During Treatment: Gait belt Activity Tolerance: Patient tolerated treatment well Patient left: with call bell/phone within reach;in chair (Pts mom in room) Nurse Communication: Mobility status PT Visit Diagnosis: Unsteadiness on feet (R26.81);Other abnormalities of gait and mobility (R26.89)     Time: 0034-9179 PT Time Calculation (min) (ACUTE ONLY): 18 min  Charges:  $Gait Training: 8-22 mins                     Kallie Locks, [place] Pager [phone] Acute Rehab   Sheral Apley 03/21/2021, 10:37 AM

## 2021-03-21 NOTE — Assessment & Plan Note (Signed)
Shooting pain in the left ear and face since starting steroids.  Hard to know what this is.  Anatomically, I don't see how it's related to her MS lesion.  Otitis externa, infectious or not, is possible, although the TM and canal look normal.  Discussed with Dr. Amada Jupiter, migraine is another consideration. - Clinical monitoring for now

## 2021-03-21 NOTE — Hospital Course (Signed)
Mrs. Chilton is a 40 y.o. F with hx MS quiescent for last 14 years, who presented with several weeks of progressive right-sided numbness, tingling and heaviness.  She had an outpatient MRI that showed a new lesion inside of her upcoming neurologist (whom she has not yet seen) recommended she proceed to the ER for steroid treatment.

## 2021-03-21 NOTE — Assessment & Plan Note (Signed)
-   Consult Neurology - Continue steroids, day 4 of 5

## 2021-03-21 NOTE — Progress Notes (Signed)
Progress Note    Janice Rose   MOL:[id number]  DOB: [birth year]  DOA: 03/17/2021     3 Date of Service: 03/21/2021   Brief summary: Janice Rose is a 40 y.o. F with hx MS quiescent for last 14 years, who presented with several weeks of progressive right-sided numbness, tingling and heaviness.  She had an outpatient MRI that showed a new lesion inside of her upcoming neurologist (whom she has not yet seen) recommended she proceed to the ER for steroid treatment.        Assessment and Plan * Multiple sclerosis (HCC) - Consult Neurology - Continue steroids, day 4 of 5 - Continue GI PPx with PPI  Carbuncle - Continue doxycycline  Normocytic anemia New since admission.   Likely artifactual given no clnical bleeding. - Check Tbili tomorrow  Otalgia, left Shooting pain in the left ear and face since starting steroids.  Hard to know what this is.  Anatomically, I don't see how it's related to her MS lesion.  Otitis externa, infectious or not, is possible, although the TM and canal look normal.  Discussed with Dr. Amada Jupiter, migraine is another consideration. - Clinical monitoring for now     Subjective:  Has some "tugging", and sharp pain in the left ear, and headache around the left eye.  No change in vision.  No other new or progressing symptoms of weakness or numbness.  No fever.  No URI symptoms.  Objective Vitals:   03/21/21 0020 03/21/21 0349 03/21/21 0732 03/21/21 1234  BP: (!) 91/59 (!) 98/51 (!) 98/55 97/67  Pulse: (!) 52 (!) 56 68 60  Resp: 17 16 16 16   Temp: 98 F (36.7 C) 98 F (36.7 C) 98.2 F (36.8 C) 98 F (36.7 C)  TempSrc: Oral Oral Oral Oral  SpO2: (!) 78% 99% 100% 100%  Weight:      Height:       58.5 kg  Vital signs were reviewed and unremarkable.   Exam Physical Exam Constitutional:      General: She is not in acute distress. HENT:     Right Ear: Tympanic membrane, ear canal and external ear normal.     Left Ear: Tympanic membrane,  ear canal and external ear normal.  No middle ear effusion. There is no impacted cerumen.     Ears:     Comments: There is tenderness of the ear canal to otoscope    Nose: No nasal deformity or rhinorrhea.  Eyes:     General: Lids are normal. Gaze aligned appropriately.     Extraocular Movements: Extraocular movements intact.     Conjunctiva/sclera: Conjunctivae normal.  Cardiovascular:     Rate and Rhythm: Normal rate and regular rhythm.     Heart sounds: Normal heart sounds, S1 normal and S2 normal. No murmur heard. Pulmonary:     Effort: Pulmonary effort is normal. No respiratory distress.     Breath sounds: No wheezing or rales.  Musculoskeletal:     Right lower leg: No edema.     Left lower leg: No edema.  Skin:    General: Skin is warm and dry.     Findings: No lesion or rash.  Neurological:     Mental Status: She is alert and oriented to person, place, and time.     Cranial Nerves: Cranial nerves are intact.     Motor: No weakness.  Psychiatric:        Attention and Perception: Attention normal.  Mood and Affect: Mood and affect normal.        Behavior: Behavior is cooperative.        Cognition and Memory: Memory normal.        Judgment: Judgment normal.       Labs / Other Information My review of labs, imaging, notes and other tests is significant for Mild anemia     Disposition Plan: Status is: Inpatient  Remains inpatient appropriate because: Ongoing IV steroids, last dose tomorrow        Time spent: 25 minutes [facility] 03/21/2021, 3:47 PM

## 2021-03-22 LAB — HEPATIC FUNCTION PANEL
ALT: 10 U/L (ref 0–44)
AST: 14 U/L — ABNORMAL LOW (ref 15–41)
Albumin: 2.7 g/dL — ABNORMAL LOW (ref 3.5–5.0)
Alkaline Phosphatase: 49 U/L (ref 38–126)
Bilirubin, Direct: <0.1 mg/dL (ref 0.0–0.2)
Total Bilirubin: 0.4 mg/dL (ref 0.3–1.2)
Total Protein: 5.4 g/dL — ABNORMAL LOW (ref 6.5–8.1)

## 2021-03-22 LAB — VITAMIN B12: Vitamin B-12: 100 pg/mL — ABNORMAL LOW (ref 180–914)

## 2021-03-22 LAB — VITAMIN D 25 HYDROXY (VIT D DEFICIENCY, FRACTURES): Vit D, 25-Hydroxy: 12.52 ng/mL — ABNORMAL LOW (ref 30–100)

## 2021-03-22 MED ORDER — PANTOPRAZOLE SODIUM 40 MG PO TBEC: 40.0000 mg | DELAYED_RELEASE_TABLET | Freq: Two times a day (BID) | ORAL | 0 refills | Status: DC

## 2021-03-22 MED ORDER — PREDNISONE 10 MG PO TABS: ORAL_TABLET | ORAL | 0 refills | Status: DC

## 2021-03-22 NOTE — Assessment & Plan Note (Signed)
Shooting pain in the left ear and face since starting steroids.  Hard to know what this is.  Anatomically, I don't see how it's related to her MS lesion.  Otitis externa, infectious or not, is possible, although the TM and canal look normal.  Discussed with Dr. Amada Jupiter, migraine is another consideration. - Clinical monitoring for now

## 2021-03-22 NOTE — Assessment & Plan Note (Signed)
Continue doxycycline

## 2021-03-22 NOTE — TOC Transition Note (Signed)
Transition of Care [facility]) - CM/SW Discharge Note   Patient Details  Name: CHAMARA DYCK MRN: [id number] Date of Birth: 08-24-1980  [facility] CM/SW Contact:  Lawerance Sabal, RN Phone Number: 03/22/2021, 8:45 AM   Clinical Narrative:    Patient is from home with spouse and family. Current recommendation are for outpatient therapy. Pt would like to attend [facility] if still needs outpatient rehab at d/c.  No DME needs.  Pt denies issues with transportation or home medications.  Pt has transport home when medically ready.   Referral placed to [facility] and added to AVS.       Barriers to Discharge: Continued Medical Work up   Patient Goals and CMS Choice     Choice offered to / list presented to : Patient  Discharge Placement                       Discharge Plan and Services   Discharge Planning Services: CM Consult                                 Social Determinants of Health (SDOH) Interventions     Readmission Risk Interventions No flowsheet data found.

## 2021-03-22 NOTE — Assessment & Plan Note (Signed)
-   Consult Neurology - Continue steroids, day 4 of 5

## 2021-03-22 NOTE — Discharge Summary (Signed)
Physician Discharge Summary   Patient name: Janice Rose  Admit date:     03/17/2021  Discharge date: 03/22/2021  Discharge Physician: Alberteen Sam   PCP: Lucianne Lei, MD   Recommendations at discharge:  Follow up with Dr. Epimenio Foot as soon as able Follow up with Dr. Mathis Bud in 1-2 weeks 3.   Dr. Mathis Bud: At follow up, please check Hgb again       Hospital Course   Janice Rose is a 40 y.o. F with hx MS quiescent for last 14 years, who presented with several weeks of progressive right-sided numbness, tingling and heaviness.  She had an outpatient MRI that showed a new lesion inside of her upcoming neurologist (whom she has not yet seen) recommended she proceed to the ER for steroid treatment.      Principal discharge diagnosis: * Multiple sclerosis with acute flare Admitted and Neurology consulted.  Started on high dose Solu-medrol. Completed 5 days and discharged with 5 day prednisone taper and Neurology follow up with Dr. Epimenio Foot.   Carbuncle This was diagnosed in the ER prior to admission, patient completed course of doxycycline while in the hospital.  No further treatment appears to be needed.  Normocytic anemia This developed after admission.  No clinical bleeding nor evidence of hemolysis, likely artifactual.  Recommend PCP attention at follow up.     Otalgia, left --> likely migraine Patient had left sided headache and ear pain.  Otic exam normal, except mild tenderness of ear canal.  Was given compazine and Benadryl and had improvement of symptoms.          Condition at discharge: good  Exam Physical Exam Constitutional:      General: She is not in acute distress. HENT:     Ears:     Comments: No tenderness to pull on left ear    Nose: No nasal deformity or rhinorrhea.     Mouth/Throat:     Lips: Pink. No lesions.     Mouth: No oral lesions.     Dentition: Normal dentition.     Pharynx: No posterior oropharyngeal erythema.  Eyes:     General:  Lids are normal. Gaze aligned appropriately.     Extraocular Movements: Extraocular movements intact.     Conjunctiva/sclera: Conjunctivae normal.  Cardiovascular:     Rate and Rhythm: Normal rate and regular rhythm.     Pulses:          Radial pulses are 2+ on the right side and 2+ on the left side.     Heart sounds: Normal heart sounds, S1 normal and S2 normal. No murmur heard. Pulmonary:     Effort: Pulmonary effort is normal. No respiratory distress.     Breath sounds: No wheezing or rales.  Musculoskeletal:     Right lower leg: No edema.     Left lower leg: No edema.  Skin:    General: Skin is warm and dry.     Findings: No lesion or rash.     Comments: Hyperpigmentation of right forearm lesion  Neurological:     Mental Status: She is alert and oriented to person, place, and time.  Psychiatric:        Attention and Perception: Attention normal.        Mood and Affect: Mood and affect normal.        Behavior: Behavior is cooperative.        Cognition and Memory: Memory normal.  Judgment: Judgment normal.      Disposition: Home  Discharge time: greater than 30 minutes.  Follow-up Information     [facility]. Call.   Specialty: Rehabilitation Why: A referral has been made for you electronically to this office. Please call them Monday to schedule an appointment. Contact information: [address] [id number] mc [place] [address] [phone]        Epimenio Foot, Pearletha Furl, MD. Call in 1 day(s).   Specialty: Neurology Contact information: [address] [place] [address] [phone]                 Allergies as of 03/22/2021   No Known Allergies      Medication List     STOP taking these medications    doxycycline 100 MG capsule Commonly known as: VIBRAMYCIN       TAKE these medications    ibuprofen 200 MG tablet Commonly known as: ADVIL Take 600 mg by mouth every 6  (six) hours as needed for moderate pain or headache.   pantoprazole 40 MG tablet Commonly known as: PROTONIX Take 1 tablet (40 mg total) by mouth 2 (two) times daily before a meal.   predniSONE 10 MG tablet Commonly known as: DELTASONE Take 50 mg (5 tabs) for 1 day then take 40 mg (4 tabs) for 1 day then take 30 mg (3 tabs) for 1 day then take 20 mg for 1 day then take 10 mg then stop       Discharge Instructions     Ambulatory referral to Neurology   Complete by: As directed    An appointment is requested as soon as able, please discuss with Dr. Epimenio Foot   Ambulatory referral to Occupational Therapy   Complete by: As directed    Ambulatory referral to Physical Therapy   Complete by: As directed    Discharge instructions   Complete by: As directed    From Dr. Maryfrances Bunnell: You were admitted for a multiple sclerosis flare.  You were treated with Solumedrol 1 gram daily for 5 days You should taper over the next 5 days:  Prednisone once daily, take 50 mg (5 tabs) on Monday, 40 mg (4 tabs) on Tues, 30 mg (3 tabs) on Weds, 20 mg (2 tabs) on Thursday and 10 mg (1 tab) on Friday then stop  For the next week, take pantroprazole to prevent gastritis (irritation of the stomach from steroids) Take pantoprazole twice daily Take 1/2 hour before a meal, or when you remember it  Call Dr. Bonnita Hollow office. I have sent them the referral, they will know best when they should see you  For any migraines, try ibuprofen 600 mg as soon as it starts, OR you can try Aleve 220 mg.  It is okay to stop the doxycycline now That dark spot is "post-inflammatory hyperpigmentation"   Increase activity slowly   Complete by: As directed        MR BRAIN W WO CONTRAST  Addendum Date: 03/16/2021   ADDENDUM REPORT: 03/16/2021 11:33 ADDENDUM: A prior from 02/05/2017 has been submitted for review. The large left-sided lesion is new from 2018, as expected. There is an additional small and nonenhancing periventricular  plaque on the right which has occurred since prior. Electronically Signed   By: Tiburcio Pea M.D.   On: 03/16/2021 11:33   Result Date: 03/16/2021 CLINICAL DATA:  Right hand and arm tingling with numbness. Right-sided face numbness EXAM: MRI HEAD WITHOUT AND WITH CONTRAST  TECHNIQUE: Multiplanar, multiecho pulse sequences of the brain and surrounding structures were obtained without and with intravenous contrast. CONTRAST:  77mL GADAVIST GADOBUTROL 1 MMOL/ML IV SOLN COMPARISON:  None. FINDINGS: Brain: History of multiple sclerosis with ovoid, somewhat radiating periventricular to juxtacortical plaques, overall mild in extent. The dominant finding is a large lesion in the left corona radiata and centrum semiovale with rim of restricted diffusion and enhancement that is discontinuous laterally and superiorly, enhancing margins measuring up to ~3 cm in length on axial images. Adjacent edematous appearance of the brain. No central restricted diffusion. No hydrocephalus, collection, or brain atrophy Vascular: Normal flow voids and vascular enhancements Skull and upper cervical spine: Normal marrow signal Sinuses/Orbits: Negative These results will be called to the ordering clinician or representative by the Radiologist Assistant, and communication documented in the PACS or [employer]. IMPRESSION: 3 cm lesion in the left corona radiata and centrum semiovale with rim enhancement and edema, features most consistent with tumefactive demyelination. Recommend follow-up after treatment to exclude neoplasm and other differential considerations. Electronically Signed: By: Tiburcio Pea M.D. On: 03/15/2021 04:29   MR Cervical Spine W or Wo Contrast  Result Date: 03/18/2021 CLINICAL DATA:  Multiple sclerosis, new event EXAM: MRI CERVICAL AND THORACIC SPINE WITHOUT AND WITH CONTRAST TECHNIQUE: Multiplanar and multiecho pulse sequences of the cervical spine, to include the craniocervical junction and cervicothoracic  junction, and the thoracic spine, were obtained without and with intravenous contrast. CONTRAST:  79mL GADAVIST GADOBUTROL 1 MMOL/ML IV SOLN COMPARISON:  None. FINDINGS: MRI CERVICAL SPINE FINDINGS Alignment: No significant listhesis. Vertebrae: Vertebral body heights are maintained. No marrow edema. No suspicious osseous lesion. Cord: Patchy STIR and T2 hyperintensity primarily from C4-C6 on sagittal imaging. This is not definitely corroborated on axial imaging. No abnormal intrathecal enhancement. Posterior Fossa, vertebral arteries, paraspinal tissues: Unremarkable. Disc levels: C2-C3:  No stenosis. C3-C4:  No stenosis. C4-C5:  No stenosis. C5-C6: Left central disc protrusion with endplate osteophytes. Partial effacement of the left ventral subarachnoid space. No significant canal or foraminal stenosis. C6-C7:  No stenosis. C7-T1:  No stenosis. MRI THORACIC SPINE FINDINGS Alignment:  Preserved. Vertebrae: Vertebral body heights are maintained. There is no marrow edema. No suspicious osseous lesion. Cord:  No definite abnormal signal. Paraspinal and other soft tissues: Unremarkable. Disc levels: Intervertebral disc heights and signal are maintained. There is no canal or foraminal stenosis at any level. IMPRESSION: Possible patchy abnormal cord signal from C4-C6 seen on sagittal imaging but not definitely corroborated on axial sequence. No abnormal thoracic cord signal. No abnormal enhancement. Electronically Signed   By: Guadlupe Spanish M.D.   On: 03/18/2021 08:54   MR THORACIC SPINE W WO CONTRAST  Result Date: 03/18/2021 CLINICAL DATA:  Multiple sclerosis, new event EXAM: MRI CERVICAL AND THORACIC SPINE WITHOUT AND WITH CONTRAST TECHNIQUE: Multiplanar and multiecho pulse sequences of the cervical spine, to include the craniocervical junction and cervicothoracic junction, and the thoracic spine, were obtained without and with intravenous contrast. CONTRAST:  44mL GADAVIST GADOBUTROL 1 MMOL/ML IV SOLN COMPARISON:   None. FINDINGS: MRI CERVICAL SPINE FINDINGS Alignment: No significant listhesis. Vertebrae: Vertebral body heights are maintained. No marrow edema. No suspicious osseous lesion. Cord: Patchy STIR and T2 hyperintensity primarily from C4-C6 on sagittal imaging. This is not definitely corroborated on axial imaging. No abnormal intrathecal enhancement. Posterior Fossa, vertebral arteries, paraspinal tissues: Unremarkable. Disc levels: C2-C3:  No stenosis. C3-C4:  No stenosis. C4-C5:  No stenosis. C5-C6: Left central disc protrusion with endplate osteophytes. Partial effacement  of the left ventral subarachnoid space. No significant canal or foraminal stenosis. C6-C7:  No stenosis. C7-T1:  No stenosis. MRI THORACIC SPINE FINDINGS Alignment:  Preserved. Vertebrae: Vertebral body heights are maintained. There is no marrow edema. No suspicious osseous lesion. Cord:  No definite abnormal signal. Paraspinal and other soft tissues: Unremarkable. Disc levels: Intervertebral disc heights and signal are maintained. There is no canal or foraminal stenosis at any level. IMPRESSION: Possible patchy abnormal cord signal from C4-C6 seen on sagittal imaging but not definitely corroborated on axial sequence. No abnormal thoracic cord signal. No abnormal enhancement. Electronically Signed   By: Guadlupe Spanish M.D.   On: 03/18/2021 08:54   MR OUTSIDE FILMS SPINE  Result Date: 03/18/2021 This examination belongs to an outside facility and is stored here for comparison purposes only.  Contact the originating outside institution for any associated report or interpretation.  Results for orders placed or performed during the hospital encounter of 03/17/21  Resp Panel by RT-PCR (Flu A&B, Covid) Nasopharyngeal Swab     Status: None   Collection Time: 03/18/21  4:24 AM   Specimen: Nasopharyngeal Swab; Nasopharyngeal(NP) swabs in vial transport medium  Result Value Ref Range Status   SARS Coronavirus 2 by RT PCR NEGATIVE NEGATIVE Final     Comment: (NOTE) SARS-CoV-2 target nucleic acids are NOT DETECTED.  The SARS-CoV-2 RNA is generally detectable in upper respiratory specimens during the acute phase of infection. The lowest concentration of SARS-CoV-2 viral copies this assay can detect is 138 copies/mL. A negative result does not preclude SARS-Cov-2 infection and should not be used as the sole basis for treatment or other patient management decisions. A negative result may occur with  improper specimen collection/handling, submission of specimen other than nasopharyngeal swab, presence of viral mutation(s) within the areas targeted by this assay, and inadequate number of viral copies(<138 copies/mL). A negative result must be combined with clinical observations, patient history, and epidemiological information. The expected result is Negative.  Fact Sheet for Patients:  BloggerCourse.com  Fact Sheet for Healthcare Providers:  SeriousBroker.it  This test is no t yet approved or cleared by the [place] FDA and  has been authorized for detection and/or diagnosis of SARS-CoV-2 by FDA under an Emergency Use Authorization (EUA). This EUA will remain  in effect (meaning this test can be used) for the duration of the COVID-19 declaration under Section 564(b)(1) of the Act, 21 U.S.C.section 360bbb-3(b)(1), unless the authorization is terminated  or revoked sooner.       Influenza A by PCR NEGATIVE NEGATIVE Final   Influenza B by PCR NEGATIVE NEGATIVE Final    Comment: (NOTE) The Xpert Xpress SARS-CoV-2/FLU/RSV plus assay is intended as an aid in the diagnosis of influenza from Nasopharyngeal swab specimens and should not be used as a sole basis for treatment. Nasal washings and aspirates are unacceptable for Xpert Xpress SARS-CoV-2/FLU/RSV testing.  Fact Sheet for Patients: BloggerCourse.com  Fact Sheet for Healthcare  Providers: SeriousBroker.it  This test is not yet approved or cleared by the [place] FDA and has been authorized for detection and/or diagnosis of SARS-CoV-2 by FDA under an Emergency Use Authorization (EUA). This EUA will remain in effect (meaning this test can be used) for the duration of the COVID-19 declaration under Section 564(b)(1) of the Act, 21 U.S.C. section 360bbb-3(b)(1), unless the authorization is terminated or revoked.  Performed at [facility], [address] [place], [place] [address]     Signed:  Alberteen Sam MD.  [facility] 03/22/2021, 5:12 PM

## 2021-03-22 NOTE — Progress Notes (Signed)
Patient ready for discharge to home; discharge instructions given and reviewed; Rx sent electronically. Patient dressed and ready to discharge.  Patient left via volunteer services in a wheelchair accompanied home by her husband.
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
## 2021-03-22 NOTE — Assessment & Plan Note (Signed)
New since admission.   Likely artifactual given no clnical bleeding. - Check Tbili tomorrow

## 2021-03-25 ENCOUNTER — Encounter: Payer: Self-pay | Admitting: Neurology

## 2021-03-25 ENCOUNTER — Telehealth: Payer: Self-pay | Admitting: Neurology

## 2021-03-25 ENCOUNTER — Ambulatory Visit (INDEPENDENT_AMBULATORY_CARE_PROVIDER_SITE_OTHER): Payer: 59 | Admitting: Neurology

## 2021-03-25 ENCOUNTER — Other Ambulatory Visit: Payer: Self-pay

## 2021-03-25 VITALS — BP 100/71 | HR 121 | Ht 62.0 in | Wt 126.0 lb

## 2021-03-25 DIAGNOSIS — R200 Anesthesia of skin: Secondary | ICD-10-CM | POA: Diagnosis not present

## 2021-03-25 DIAGNOSIS — R29898 Other symptoms and signs involving the musculoskeletal system: Secondary | ICD-10-CM | POA: Diagnosis not present

## 2021-03-25 DIAGNOSIS — Z79899 Other long term (current) drug therapy: Secondary | ICD-10-CM | POA: Diagnosis not present

## 2021-03-25 DIAGNOSIS — G35 Multiple sclerosis: Secondary | ICD-10-CM

## 2021-03-25 MED ORDER — VITAMIN D (ERGOCALCIFEROL) 1.25 MG (50000 UNIT) PO CAPS: 50000.0000 [IU] | ORAL_CAPSULE | ORAL | 1 refills | Status: DC

## 2021-03-25 MED ORDER — CYANOCOBALAMIN 1000 MCG/ML IJ SOLN: INTRAMUSCULAR | 0 refills | Status: DC

## 2021-03-25 MED ORDER — CYANOCOBALAMIN 1000 MCG/ML IJ SOLN
1000.0000 ug | Freq: Once | INTRAMUSCULAR | Status: AC
  Administered 2021-03-25: 1000 ug via INTRAMUSCULAR

## 2021-03-25 NOTE — Telephone Encounter (Signed)
Placed [facility] lab in [facility] lock box for routine lab pick up. Results pending. 
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
## 2021-03-30 LAB — VARICELLA ZOSTER ANTIBODY, IGG: Varicella zoster IgG: 1202 index (ref >165)

## 2021-03-30 LAB — IGG, IGA, IGM
IgA/Immunoglobulin A, Serum: 246 mg/dL (ref 87–352)
IgG (Immunoglobin G), Serum: 1209 mg/dL (ref 586–1602)
IgM (Immunoglobulin M), Srm: 203 mg/dL (ref 26–217)

## 2021-03-30 LAB — HEPATITIS B SURFACE ANTIBODY,QUALITATIVE: Hep B Surface Ab, Qual: NONREACTIVE

## 2021-03-30 LAB — HEPATITIS B SURFACE ANTIGEN: Hepatitis B Surface Ag: NEGATIVE

## 2021-03-30 LAB — QUANTIFERON-TB GOLD PLUS
QuantiFERON Mitogen Value: >10 IU/mL
QuantiFERON Nil Value: 0.11 IU/mL
QuantiFERON TB1 Ag Value: 0.11 IU/mL
QuantiFERON TB2 Ag Value: 0.1 IU/mL
QuantiFERON-TB Gold Plus: NEGATIVE

## 2021-03-30 LAB — HEPATITIS B CORE ANTIBODY, TOTAL: Hep B Core Total Ab: NEGATIVE

## 2021-03-30 LAB — HEPATITIS C ANTIBODY: Hep C Virus Ab: 0.2 s/co ratio (ref 0.0–0.9)

## 2021-03-31 ENCOUNTER — Other Ambulatory Visit: Payer: Self-pay

## 2021-03-31 ENCOUNTER — Other Ambulatory Visit: Payer: 59

## 2021-03-31 ENCOUNTER — Ambulatory Visit: Payer: 59 | Admitting: Physical Therapy

## 2021-03-31 ENCOUNTER — Encounter: Payer: Self-pay | Admitting: Occupational Therapy

## 2021-03-31 ENCOUNTER — Telehealth: Payer: Self-pay | Admitting: Neurology

## 2021-03-31 ENCOUNTER — Ambulatory Visit: Payer: 59 | Attending: Internal Medicine | Admitting: Occupational Therapy

## 2021-03-31 DIAGNOSIS — R208 Other disturbances of skin sensation: Secondary | ICD-10-CM | POA: Diagnosis present

## 2021-03-31 DIAGNOSIS — R278 Other lack of coordination: Secondary | ICD-10-CM | POA: Diagnosis present

## 2021-03-31 DIAGNOSIS — M6281 Muscle weakness (generalized): Secondary | ICD-10-CM

## 2021-03-31 NOTE — Therapy (Addendum)
[facility] [address] [place], [place], [address] Phone: [phone]   Fax:  [phone]  Occupational Therapy Evaluation  Patient Details  Name: Janice Rose MRN: [id number] Date of Birth: 03-27-1981 Referring Provider (OT): Dr. Joen Laura (but follows up with Dr. Despina Arias)   Encounter Date: 03/31/2021   OT End of Session - 03/31/21 1253     Visit Number 1    Number of Visits 5    Date for OT Re-Evaluation 05/30/21    Authorization Type [employer], 30 visit limit OT & PT combined    OT Start Time 1100    OT Stop Time 1148    OT Time Calculation (min) 48 min    Activity Tolerance Patient tolerated treatment well    Behavior During Therapy [facility] for tasks assessed/performed             Past Medical History:  Diagnosis Date   MS (multiple sclerosis) (HCC)     Past Surgical History:  Procedure Laterality Date   none      There were no vitals filed for this visit.   Subjective Assessment - 03/31/21 1108     Subjective  pt reports a lot of improvement in the last week    Patient is accompanied by: Family member   husband   Pertinent History MS exacerbation.    PMH:  MS diagnosed 2005    Patient Stated Goals improve use of R hand    Currently in Pain? No/denies               [facility] OT Assessment - 03/31/21 0001       Assessment   Medical Diagnosis MS exacerbation    Referring Provider (OT) Dr. Joen Laura (but follows up with Dr. Despina Arias)    Onset Date/Surgical Date 03/18/21    Hand Dominance Right      Precautions   Precautions None      Balance Screen   Has the patient fallen in the past 6 months No      Home  Environment   Family/patient expects to be discharged to: Private residence    Lives With --   husband, in-laws, 7 and 47 y.o.     Prior Function   Level of Independence Independent    Leisure spending time/doing activities with kids       ADL   ADL comments Pt performing BADLs and IADLs mod I.  Pt reports difficulty with daughter's braid (but improved), husband assisted in putting back on earring.  stirring with L hand.  fatigues quickly with RUE/ hand and has difficulty lifting gallon drink.      IADL   Light Housekeeping --   pt performing mod I   Meal Prep --   able to complete mod I, using LUE more   Cabin crew Status Independent      Written Expression   Dominant Hand Right      Vision - History   Baseline Vision --   [facility]   Additional Comments Pt denies changes      Cognition   Overall Cognitive Status Within Functional Limits for tasks assessed   pt denies change     Sensation   Additional Comments pt reports mild changes in R hand (feels "like a glue layer" and "frostbite"),but can still feel everything.      Coordination   9  Hole Peg Test Right;Left    Right 9 Hole Peg Test 27.38    Left 9 Hole Peg Test 18.5      ROM / Strength   AROM / PROM / Strength AROM;Strength      AROM   Overall AROM  Within functional limits for tasks performed   BUEs     Strength   Overall Strength Deficits    Overall Strength Comments RUE proximal strength grossly 4+ to 5/5 and LUE proximal strength grossly 5/5      Hand Function   Right Hand Grip (lbs) 41    Right Hand Lateral Pinch 13 lbs    Right Hand 3 Point Pinch 16 lbs   tip 6lbs   Left Hand Grip (lbs) 33.5    Left Hand Lateral Pinch 12 lbs    Left 3 point pinch 16 lbs   tip 10lbs                             OT Education - 03/31/21 1252     Education Details Coordination and Red Putty HEP--see pt instructions (Cautioned pt to avoid over fatigue with HEP and gradually incr repetitions).  Pt reports yoga and aerobic exercises were recommended to her by physician--agreed that these would be good activities, but to begin very slowly and avoid fatigue/take frequent breaks even if she feels well  until she knows how body will respond.    Person(s) Educated Patient;Spouse    Methods Explanation;Demonstration;Handout;Verbal cues    Comprehension Verbalized understanding;Returned demonstration              OT Short Term Goals - 03/31/21 1316       OT SHORT TERM GOAL #1   Title Pt will be independent with initial HEP for RUE coordination/strength    Time 4    Period Weeks    Status New               OT Long Term Goals - 03/31/21 1316       OT LONG TERM GOAL #1   Title Pt will be independent with updated HEP for RUE coordination and strength.    Time 8    Period Weeks    Status New      OT LONG TERM GOAL #2   Title Pt will improve R hand coordination for ADLs/IADLs as shown by completing 9-hole peg test in 21sec or less.    Baseline 27.38sec    Time 8    Period Weeks    Status New      OT LONG TERM GOAL #3   Title Pt will improve R 2point tip pinch to at least 9lbs to assist with ADLs/IADLs.    Time 8    Period Weeks    Status New                   Plan - 03/31/21 1258     Clinical Impression Statement Pt is a 40 y.o. female referred for MS exacerbation with hospitalization 03/18/21-03/22/21.  Pt was initially diagnosed with MS in 2005.  Pt is a wife and mother of 2 who was indpendent prior to exacerbation.  Pt is now mod I with ADLs/IADLs but reports incr fatigue and difficulty/incr effort using dominant RUE.  Pt presents today with decr strength and coordination (and mild sensation changes) affecting RUE functional use.  Although pt has experienced improvements, but would benefit from occupational  therapy for incr ease with ADLs/IADLs and improved dominant RUE functional use, coordination, strength, and activity tolerance.    OT Occupational Profile and History Problem Focused Assessment - Including review of records relating to presenting problem    Occupational performance deficits (Please refer to evaluation for details): ADL's;IADL's    Body  Structure / Function / Physical Skills ADL;UE functional use;IADL;Endurance;Coordination;Sensation;FMC;Dexterity;Strength    Rehab Potential Good    Clinical Decision Making Limited treatment options, no task modification necessary    Comorbidities Affecting Occupational Performance: None    Modification or Assistance to Complete Evaluation  No modification of tasks or assist necessary to complete eval    OT Frequency --   eval + 4 visits (prn) over 8 weeks   OT Duration --   eval +4 visits (prn) over 8 weeks--likely will not use 4 visits due to mild deficits which are improving and high copay   OT Treatment/Interventions Self-care/ADL training;DME and/or AE instruction;Therapeutic activities;Therapeutic exercise;Neuromuscular education;Energy conservation;Patient/family education    Plan Pt was instructed in initial HEP today for R hand strength/coordination.  Due to high copay and current functional level, plan to have pt schedule in 2-3 weeks to add to HEP as well as address continued functional difficulties if needed (additional visits prn depending on progress)    Consulted and Agree with Plan of Care Patient;Family member/caregiver    Family Member Consulted husband             Patient will benefit from skilled therapeutic intervention in order to improve the following deficits and impairments:   Body Structure / Function / Physical Skills: ADL, UE functional use, IADL, Endurance, Coordination, Sensation, FMC, Dexterity, Strength       Visit Diagnosis: Other lack of coordination - Plan: Ot plan of care cert/re-cert  Muscle weakness (generalized) - Plan: Ot plan of care cert/re-cert  Other disturbances of skin sensation - Plan: Ot plan of care cert/re-cert    Problem List Patient Active Problem List   Diagnosis Date Noted   High risk medication use 03/25/2021   Right hand weakness 03/25/2021   Numbness 03/25/2021   Carbuncle 03/21/2021   Normocytic anemia 03/21/2021    Otalgia, left 03/21/2021   Multiple sclerosis (HCC) 03/18/2021    Kaitrin Seybold, OT/L 03/31/2021, 7:03 PM  [facility] [address] [place], [place], [address] Phone: [phone]   Fax:  [phone]  Name: Janice Rose MRN: [id number] Date of Birth: 1980/08/07  Willa Frater, OTR/L [facility] [address] [place], [place]  [address] [phone] phone [phone] 03/31/21 7:03 PM

## 2021-03-31 NOTE — Telephone Encounter (Signed)
Called the patient back to gather more information.  I was unsure of what the patient was referring to.  Patient states in the appointment days there is an order that has Dr. Bonnita Hollow name attached to it from 03/16/2021.  Upon opening this order/appointment, this was ordered by a different physician most likely when she was in the hospital.  Dr. Bonnita Hollow name was listed as a referring provider most likely because that physician talked with him about the patient's care.  Advised the patient that it appears that the ordering physician may have attached the wrong diagnosis code to the order.  When he/she entered in the order the entered in MS which pulled up mitral stenosis and instead of clicking multiple sclerosis they click the other diagnosis instead.  Reiterated to the patient that it is not listed in her chart as a diagnosis.  It was incorrectly attached to the order.  Patient requested the name of the provider who ordered the test.  I was able to provide her with that information.  Patient was appreciative for our help.

## 2021-03-31 NOTE — Patient Instructions (Signed)
Access Code: [email or web address] URL: [email or web address] Date: 03/31/2021 Prepared by: Sherlie Ban  Exercises Supine Bridge - 1-2 x daily - 5 x weekly - 1-2 sets - 10 reps Shoulder Bridge Prep with Marching - 1-2 x daily - 5 x weekly - 2 sets - 5 reps

## 2021-03-31 NOTE — Therapy (Signed)
[facility] [address] [place], [place], [address] Phone: [phone]   Fax:  [phone]  Physical Therapy Evaluation Only  Patient Details  Name: Janice Rose MRN: [id number] Date of Birth: [birth year] Referring Provider (PT): Danford, Earl Lites, MD (being followed by Dr. Epimenio Foot)   Encounter Date: 03/31/2021   PT End of Session - 03/31/21 1241     Visit Number 1   eval only   PT Start Time 1153    PT Stop Time 1228    PT Time Calculation (min) 35 min             Past Medical History:  Diagnosis Date   MS (multiple sclerosis) (HCC)     Past Surgical History:  Procedure Laterality Date   none      There were no vitals filed for this visit.    Subjective Assessment - 03/31/21 1154     Subjective Pt experienced several weeks RUE and RLE weakness, numbness, and heaviness. Found to have MS flare. Hospitalized 03/18/21 and discharged 03/22/21. RLE feels about 90% back to normal, no longer has to think about her RLE when she is walking. Still being a little bit cautious. Reports R leg is feeling better than her hand.    Pertinent History PMH: MS (diagnosed in 2005)    Limitations Walking    Patient Stated Goals wants to be back the way she was. Not have to think when she is walking or doing day to day activities.    Currently in Pain? No/denies                [facility] PT Assessment - 03/31/21 1159       Assessment   Medical Diagnosis MS exacerbation    Referring Provider (PT) Danford, Earl Lites, MD   being followed by Dr. Epimenio Foot   Onset Date/Surgical Date 03/18/21    Hand Dominance Right    Prior Therapy When initially diagnosed in 2005      Precautions   Precautions None      Balance Screen   Has the patient fallen in the past 6 months No      Home Environment   Living Environment Private residence    Type of Home Apartment   no stairs     Prior Function   Level of Independence  Independent    Leisure spending time with kids      Sensation   Light Touch Appears Intact    Additional Comments rerpots very faint tingling in RLE      Coordination   Gross Motor Movements are Fluid and Coordinated Yes      ROM / Strength   AROM / PROM / Strength Strength      Strength   Strength Assessment Site Hip;Knee;Ankle    Right/Left Hip Right;Left    Right Hip Flexion 5/5    Right Hip Extension 4/5    Right Hip ABduction 5/5    Left Hip Flexion 5/5    Left Hip Extension 4+/5    Left Hip ABduction 5/5    Right/Left Knee Right;Left    Right Knee Flexion 5/5    Right Knee Extension 5/5    Left Knee Flexion 5/5    Left Knee Extension 5/5    Right/Left Ankle Right;Left    Right Ankle Dorsiflexion 5/5    Right Ankle Plantar Flexion 5/5    Left Ankle Dorsiflexion 5/5    Left Ankle Plantar Flexion 5/5  Transfers   Transfers Sit to Stand;Stand to Sit    Sit to Stand 7: Independent    Five time sit to stand comments  8.78 seconds    Stand to Sit 7: Independent      Ambulation/Gait   Ambulation/Gait Yes    Ambulation/Gait Assistance 7: Independent    Gait Pattern Within Functional Limits    Gait velocity 9 seconds = 3.64 ft/sec      Functional Gait  Assessment   Gait assessed  Yes    Gait Level Surface Walks 20 ft in less than 5.5 sec, no assistive devices, good speed, no evidence for imbalance, normal gait pattern, deviates no more than 6 in outside of the 12 in walkway width.    Change in Gait Speed Able to smoothly change walking speed without loss of balance or gait deviation. Deviate no more than 6 in outside of the 12 in walkway width.    Gait with Horizontal Head Turns Performs head turns smoothly with no change in gait. Deviates no more than 6 in outside 12 in walkway width    Gait with Vertical Head Turns Performs head turns with no change in gait. Deviates no more than 6 in outside 12 in walkway width.    Gait and Pivot Turn Pivot turns safely within 3  sec and stops quickly with no loss of balance.    Step Over Obstacle Is able to step over 2 stacked shoe boxes taped together (9 in total height) without changing gait speed. No evidence of imbalance.    Gait with Narrow Base of Support Is able to ambulate for 10 steps heel to toe with no staggering.    Gait with Eyes Closed Walks 20 ft, no assistive devices, good speed, no evidence of imbalance, normal gait pattern, deviates no more than 6 in outside 12 in walkway width. Ambulates 20 ft in less than 7 sec.    Ambulating Backwards Walks 20 ft, no assistive devices, good speed, no evidence for imbalance, normal gait    Steps Alternating feet, no rail.    Total Score 30                  Access Code: [email or web address] URL: [email or web address] Date: 03/31/2021 Prepared by: Roseanne Reno patient exercises to work on hip extensor strengthening for home.   Exercises Supine Bridge - 1-2 x daily - 5 x weekly - 1-2 sets - 10 reps Shoulder Bridge Prep with Marching - 1-2 x daily - 5 x weekly - 2 sets - 5 reps      Objective measurements completed on examination: See above findings.                PT Education - 03/31/21 1427     Education Details Had discussion about community fitness for aerobic activity and going to exercise classes. Discussed best option would be a membership to the [employer], or [employer] where they have aerobic exercise equipment as well as a variety of exercise classes that would be appropriate for pt to participate in vs. other higher intensity fitness studios that pt was inquiring about. Discussed importance of not over-doing exercise and monitoring fatigue levels.    Person(s) Educated Patient    Methods Explanation    Comprehension Verbalized understanding                         Plan - 03/31/21 1433     Clinical Impression  Statement Pt is a 40 year old female referred to neuro OPPT for recent MS  exacerbation affecting her RUE/RLE. Pt reports that she feels almost back to her baseline. Per evaluation today, pt is not at a fall risk. Pt performed 5x sit <> stand in 8.78 seconds with no UE support, gait speed in 3.64 ft/sec, and scored a 30/30 on the FGA. Pt demonstrated slight weakness in R>L hip extensors. Gave pt a couple exercises to work on at home for strengthening. Discussed community fitness going forwards such as joining [employer] or [employer] for aerobic exercise/fitness classes - making sure that pt is monitoring fatigue levels. Will not pick up pt for PT at this time based on pt almost being back at her baseline and with $100 co pay per visit. Pt in agreement with plan.    Stability/Clinical Decision Making Stable/Uncomplicated    Clinical Decision Making Low    PT Frequency One time visit             Patient will benefit from skilled therapeutic intervention in order to improve the following deficits and impairments:     Visit Diagnosis: Muscle weakness (generalized)     Problem List Patient Active Problem List   Diagnosis Date Noted   High risk medication use 03/25/2021   Right hand weakness 03/25/2021   Numbness 03/25/2021   Carbuncle 03/21/2021   Normocytic anemia 03/21/2021   Otalgia, left 03/21/2021   Multiple sclerosis (HCC) 03/18/2021    Drake Leach, PT, DPT  03/31/2021, 2:39 PM  [facility] [address] [place], [place], [address] Phone: [phone]   Fax:  [phone]  Name: GABRYELLA MURFIN MRN: [id number] Date of Birth: Sep 10, 1980

## 2021-03-31 NOTE — Patient Instructions (Signed)
    Coordination Activities  Perform the following activities for 15 minutes 1 times per day with right hand(s).    Rotate ball in fingertips (clockwise and counter-clockwise). Ball push-up:  Hold ball in fingertips and bend fingers to bring to palm and back up Flip cards 1 at a time as fast as you can--make sure you use index finger  Deal cards with your thumb (Hold deck in hand and push card off top with thumb). Pick up coins and stack Pick up coins one at a time until you get 5-10 in your hand, then move coins from palm to fingertips to place in container one at a time. (To pinch in index/thumb) Practice writing and/or typing.    Grip Strengthening (Resistive Putty)   Squeeze putty using thumb and all fingers. Repeat 10 times. Do 1 sessions per day.   Extension (Assistive Putty)   Roll putty back and forth, being sure to use all fingertips. Repeat 1-2 times. Do 1 sessions per day.  Then pinch as below.   Palmar Pinch Strengthening (Resistive Putty)   Pinch putty between thumb and each fingertip in turn after rolling out    Finger and Thumb Extension (Resistive Putty)   With thumb and all fingers in center of putty donut, stretch out. Repeat 5 times. Do 1 sessions per day.

## 2021-03-31 NOTE — Telephone Encounter (Signed)
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 Would like a call from the nurse to discuss if this is correct.

## 2021-04-06 NOTE — Telephone Encounter (Signed)
Received JCV result Index value-0.27  JCV antibody was indeterminate Antibody inhibition assay was negative.  Will send to Dr Epimenio Foot for review

## 2021-04-07 ENCOUNTER — Telehealth: Payer: Self-pay | Admitting: Neurology

## 2021-04-07 NOTE — Telephone Encounter (Signed)
Pt called asking if there were any alternatives other than the medication that was prescribed by Dr. Epimenio Foot. Pt requesting a call back.

## 2021-04-07 NOTE — Telephone Encounter (Signed)
Blood work looks good.  She do not have any evidence of chronic infections.  JCV antibody was negative.  Other tests were fine.    I spoke to the patient and her sister who is a Teacher, early years/pre.  We went over some of the options.  Janice Rose would prefer to go on an oral agent and we discussed teriflunomide, dimethyl fumarate, S1 P receptor modulators (fingolimod and Zeposia) she will give this some more thought and let us know tomorrow.

## 2021-04-08 ENCOUNTER — Encounter: Payer: Self-pay | Admitting: Neurology

## 2021-04-08 ENCOUNTER — Other Ambulatory Visit: Payer: Self-pay | Admitting: Neurology

## 2021-04-08 MED ORDER — DIMETHYL FUMARATE 240 MG PO CPDR
DELAYED_RELEASE_CAPSULE | ORAL | 3 refills | Status: DC
Start: 2021-04-08 — End: 2021-04-09

## 2021-04-08 MED ORDER — DIMETHYL FUMARATE 120 MG PO CPDR: DELAYED_RELEASE_CAPSULE | ORAL | 0 refills | Status: DC

## 2021-04-08 NOTE — Telephone Encounter (Signed)
Pt called stating she has decided to go with the dimethyl fumarate.

## 2021-04-08 NOTE — Telephone Encounter (Signed)
Dr. Epimenio Foot- did you want to go ahead and e-scribe to specialty pharmacy?

## 2021-04-08 NOTE — Telephone Encounter (Signed)
Can you f/u w/ pt tomorrow on this?

## 2021-04-09 MED ORDER — DIMETHYL FUMARATE 240 MG PO CPDR: DELAYED_RELEASE_CAPSULE | ORAL | 3 refills | Status: DC

## 2021-04-09 MED ORDER — DIMETHYL FUMARATE 120 MG PO CPDR: DELAYED_RELEASE_CAPSULE | ORAL | 0 refills | Status: DC

## 2021-04-09 NOTE — Telephone Encounter (Addendum)
Dr. Epimenio Foot sent dimethyl fumarate RX to [facility]. They ran a claim but patient's insurance requires that she use  [facility].  I completed PA for dimethyl fumarate 240mg  via [facility]. Sent to . [employer].  I completed PA for dimethyl fumarate 120mg  via [facility]. Sent to : [id number]. [facility] .  Dimethyl fumarate scripts sent to [employer].

## 2021-04-09 NOTE — Addendum Note (Signed)
Addended by: Geronimo Running A on: 04/09/2021 10:42 AM   Modules accepted: Orders

## 2021-04-13 ENCOUNTER — Other Ambulatory Visit: Payer: Self-pay

## 2021-04-13 MED ORDER — DIMETHYL FUMARATE 240 MG PO CPDR: DELAYED_RELEASE_CAPSULE | ORAL | 3 refills | Status: DC

## 2021-04-13 MED ORDER — DIMETHYL FUMARATE 120 MG PO CPDR: DELAYED_RELEASE_CAPSULE | ORAL | 0 refills | Status: DC

## 2021-04-13 NOTE — Telephone Encounter (Signed)
[employer] has approved dimethyl fumarate 240mg  effective 04/11/2021-04/10/2022. PA # 14/01/2022.

## 2021-04-13 NOTE — Telephone Encounter (Signed)
I called [facility] [facility]. They don't handle specialty medications. The RX was transferred to [facility]. I called [facility]. Spoke with Rinaldo Cloud. She reports that the copay is over $2000. The patient can apply for financial assistance through other organizations if she would like.  The patient sent a mychart message in the meantime asking for the RX for dimethyl fumarate to go to [facility]. She can use a goodrx coupon for it, hopefully.

## 2021-04-15 ENCOUNTER — Telehealth: Payer: Self-pay | Admitting: *Deleted

## 2021-04-15 NOTE — Telephone Encounter (Signed)
Faxed completed/signed [employer] start form to [employer] at [phone]. Received fax confirmation.  

## 2021-04-19 ENCOUNTER — Telehealth: Payer: Self-pay | Admitting: Neurology

## 2021-04-19 ENCOUNTER — Encounter: Payer: Self-pay | Admitting: Neurology

## 2021-04-19 NOTE — Telephone Encounter (Signed)
I received an after-hours call message from patient reporting a cramp that started in the right foot and went to the face.  I called her back and was able to talk to her for about 10 minutes. Page came in at 8:21 am and I called her at 9:34 am. She reported that she had a recent MS exacerbation was in the hospital, discharged in late November.  She had recently seen Dr. Epimenio Foot but had not started any new medication yet, was waiting for insurance authorization.  She was treated with steroids in the hospital and had a brain and neck MRI.  She reports several episodes of right leg cramping and severe pain that lasts for several seconds at a time but she has had already 3 episodes, first episode happened after she woke up and tried to get out of bed.  She reported that she fell to the ground and had a severe cramp, her husband had to assist her.  When she woke up she had a heat/burning sensation in the back of her head.  She reports similar symptoms affecting the right side but not as significant as she is currently experiencing, reporting that it feels like a seizure but its not a seizure.  She did not have the symptoms when she saw Dr. Epimenio Foot recently.  She was advised that the quickest way to evaluate and treat her is through the emergency room.  She was advised regarding the variability of symptoms in different MS patients.  She had recently completed steroids within the month. She asked for a prescription that could relieve the symptoms but I advised her to get evaluated first as these are new symptoms and frequent enough to alarm her.  I explained to her that options are highly dependent on her presentation; the quickest way to evaluate her is through the emergency room. She was advised, that I would send a message to her neurologist to keep him posted and to have him weigh in.  Whether or not he would want to restart steroids or expedite a different medication will have to depend on his evaluation/assessment.  She  was agreeable to get evaluated in the emergency room but asked if I could call the emergency room and let them know that she is coming.  Unfortunately, there is no way for me to expedite her evaluation through the ER because it will depend on her triage.  I explained to her that if there is an emergency such as a heart attack or stroke patient or trauma patient, they will prioritize these patients.  She may need an updated cervical spine MRI as her symptoms are primarily in the right leg and right arm.  The quickest way to get an MRI on an urgent basis/emergency basis is through the ER.

## 2021-04-20 ENCOUNTER — Encounter (HOSPITAL_COMMUNITY): Payer: Self-pay

## 2021-04-20 ENCOUNTER — Ambulatory Visit: Payer: 59 | Admitting: Occupational Therapy

## 2021-04-20 ENCOUNTER — Emergency Department (HOSPITAL_COMMUNITY)
Admission: EM | Admit: 2021-04-20 | Discharge: 2021-04-20 | Disposition: A | Discharge status: Home / Self Care | Payer: 59 | Source: Home / Self Care | Attending: Emergency Medicine | Admitting: Emergency Medicine

## 2021-04-20 DIAGNOSIS — R202 Paresthesia of skin: Secondary | ICD-10-CM | POA: Insufficient documentation

## 2021-04-20 DIAGNOSIS — G35 Multiple sclerosis: Secondary | ICD-10-CM | POA: Diagnosis not present

## 2021-04-20 DIAGNOSIS — R000 Tachycardia, unspecified: Secondary | ICD-10-CM | POA: Insufficient documentation

## 2021-04-20 LAB — CBC WITH DIFFERENTIAL/PLATELET
Abs Immature Granulocytes: 0.05 10*3/uL (ref 0.00–0.07)
Basophils Absolute: 0 10*3/uL (ref 0.0–0.1)
Basophils Relative: 0 %
Eosinophils Absolute: 0 10*3/uL (ref 0.0–0.5)
Eosinophils Relative: 0 %
HCT: 39 % (ref 36.0–46.0)
Hemoglobin: 12.7 g/dL (ref 12.0–15.0)
Immature Granulocytes: 1 %
Lymphocytes Relative: 27 %
Lymphs Abs: 2.6 10*3/uL (ref 0.7–4.0)
MCH: 30.9 pg (ref 26.0–34.0)
MCHC: 32.6 g/dL (ref 30.0–36.0)
MCV: 94.9 fL (ref 80.0–100.0)
Monocytes Absolute: 0.7 10*3/uL (ref 0.1–1.0)
Monocytes Relative: 7 %
Neutro Abs: 6.3 10*3/uL (ref 1.7–7.7)
Neutrophils Relative %: 65 %
Platelets: 402 10*3/uL — ABNORMAL HIGH (ref 150–400)
RBC: 4.11 MIL/uL (ref 3.87–5.11)
RDW: 14.3 % (ref 11.5–15.5)
WBC: 9.7 10*3/uL (ref 4.0–10.5)
nRBC: 0 % (ref 0.0–0.2)

## 2021-04-20 LAB — BASIC METABOLIC PANEL
Anion gap: 8 (ref 5–15)
BUN: 7 mg/dL (ref 6–20)
CO2: 24 mmol/L (ref 22–32)
Calcium: 9.3 mg/dL (ref 8.9–10.3)
Chloride: 105 mmol/L (ref 98–111)
Creatinine, Ser: 0.56 mg/dL (ref 0.44–1.00)
GFR, Estimated: >60 mL/min (ref >60)
Glucose, Bld: 111 mg/dL — ABNORMAL HIGH (ref 70–99)
Potassium: 4.2 mmol/L (ref 3.5–5.1)
Sodium: 137 mmol/L (ref 135–145)

## 2021-04-20 LAB — I-STAT BETA HCG BLOOD, ED (MC, WL, AP ONLY): I-stat hCG, quantitative: <5 m[IU]/mL (ref <5)

## 2021-04-20 MED ORDER — DIAZEPAM 5 MG/ML IJ SOLN
2.5000 mg | Freq: Once | INTRAMUSCULAR | Status: AC
  Administered 2021-04-20: 11:00:00 2.5 mg via INTRAVENOUS
  Filled 2021-04-20: qty 2

## 2021-04-20 MED ORDER — VUMERITY 231 MG PO CPDR: DELAYED_RELEASE_CAPSULE | ORAL | 0 refills | Status: DC

## 2021-04-20 MED ORDER — GABAPENTIN 300 MG PO CAPS: 300.0000 mg | ORAL_CAPSULE | Freq: Three times a day (TID) | ORAL | 0 refills | Status: DC

## 2021-04-20 NOTE — Discharge Instructions (Addendum)
Taking 2 mg of gabapentin 3 times daily. Follow-up with neurology next week if possible.  Return if the symptoms change or worsen

## 2021-04-20 NOTE — ED Triage Notes (Signed)
Pt presents with c/o spasms. Pt reports she was re-diagnosed with MS in November. Pt reports the spasms have been happening since yesterday and she is unable to control her arms and legs when it happens. Pt reports multiple episodes since it began. Pt ambulatory to triage, alert and oriented.

## 2021-04-20 NOTE — Telephone Encounter (Signed)
Received notice from [facility] that they need the RX for Vumerity e-scribed to them. Attempted to e-scribe vumerity RX but it will not go through.  I called [facility]. Spoke with Riki Rusk, pharmD. Verified vumerity RX titration and maintenance.

## 2021-04-20 NOTE — Addendum Note (Signed)
Addended by: Geronimo Running A on: 04/20/2021 08:54 AM   Modules accepted: Orders

## 2021-04-20 NOTE — Telephone Encounter (Signed)
I called [employer]. Completed PA for Vumerity over the phone. A reference number was unable to be generated. The PA should have a determination within 24-72 hours.

## 2021-04-20 NOTE — Telephone Encounter (Signed)
Pt called, Have read the MyChart message. Would like a call from the nurse to discuss experiencing spasm and tremors related to MS and needing an appt.

## 2021-04-20 NOTE — Telephone Encounter (Signed)
Pt also sent mychart message. Replied back.

## 2021-04-20 NOTE — ED Provider Notes (Signed)
[place] DEPT Provider Note   CSN: [id number] Arrival date & time: 04/20/21  0946     History Chief Complaint  Patient presents with   Spasms    Janice Rose is a 40 y.o. female.  HPI  Patient with history of MS presents due to parathesias .  They are right-sided, started acutely 2 days ago and happen intermittently.  They last for about 15 to 30 seconds, feels like somebody is pulling her right leg with that and electric shock shooting through the right side of her body.  She feels like she is shaking or spasming, but her husband states she is not physically spasming when he is witnessing these events but she looks out of it.  She is currently struggling to get on a preventative MS medicine due to prior Auth.   Past Medical History:  Diagnosis Date   MS (multiple sclerosis) [facility]     Patient Active Problem List   Diagnosis Date Noted   High risk medication use 03/25/2021   Right hand weakness 03/25/2021   Numbness 03/25/2021   Carbuncle 03/21/2021   Normocytic anemia 03/21/2021   Otalgia, left 03/21/2021   Multiple sclerosis ([place]) 03/18/2021    Past Surgical History:  Procedure Laterality Date   none       OB History   No obstetric history on file.     Family History  Problem Relation Age of Onset   Hypertension Mother    Heart disease Father    Hypertension Father    Diabetes Father    Multiple sclerosis Neg Hx     Social History   Tobacco Use   Smoking status: Never   Smokeless tobacco: Never  Vaping Use   Vaping Use: Never used  Substance Use Topics   Alcohol use: Yes    Comment: occasional   Drug use: Never    Home Medications Prior to Admission medications   Medication Sig Start Date End Date Taking? Authorizing Provider  cyanocobalamin (,VITAMIN B-12,) 1000 MCG/ML injection Inject 1 cc monthly for 6 months.  Please dispense with #6 1 cc syringes and #6 27 g 1" needles 03/25/21   Sater, Nanine Means, MD   Diroximel Fumarate (VUMERITY) 231 MG CPDR Titration: 231mg  po BIDx7days then 462mg  (231mg x2) po BID x23days Maintenance: 462mg  (231mg x2) po BID 04/20/21   Sater, Nanine Means, MD  ibuprofen (ADVIL) 200 MG tablet Take 600 mg by mouth every 6 (six) hours as needed for moderate pain or headache. Patient not taking: Reported on 03/31/2021    [provider]  pantoprazole (PROTONIX) 40 MG tablet Take 1 tablet (40 mg total) by mouth 2 (two) times daily before a meal. Patient not taking: Reported on 03/31/2021 03/22/21   Edwin Dada, MD  predniSONE (DELTASONE) 10 MG tablet Take 50 mg (5 tabs) for 1 day then take 40 mg (4 tabs) for 1 day then take 30 mg (3 tabs) for 1 day then take 20 mg for 1 day then take 10 mg then stop Patient not taking: Reported on 03/25/2021 03/22/21   Edwin Dada, MD  Vitamin D, Ergocalciferol, (DRISDOL) 1.25 MG (50000 UNIT) CAPS capsule Take 1 capsule (50,000 Units total) by mouth every 7 (seven) days. 03/25/21   Sater, Nanine Means, MD    Allergies    Patient has no known allergies.  Review of Systems   Review of Systems  Constitutional:  Negative for chills and fever.  HENT:  Negative for ear pain  and sore throat.   Eyes:  Negative for pain and visual disturbance.  Respiratory:  Negative for cough and shortness of breath.   Cardiovascular:  Negative for chest pain and palpitations.  Gastrointestinal:  Negative for abdominal pain, nausea and vomiting.  Genitourinary:  Negative for dysuria and hematuria.  Musculoskeletal:  Positive for arthralgias and myalgias. Negative for back pain.  Skin:  Negative for color change and rash.  Neurological:  Negative for syncope.       "Burning in head"  All other systems reviewed and are negative.  Physical Exam Updated Vital Signs BP (!) 136/92 (BP Location: Left Arm)    Pulse (!) 109    Temp 98.4 F (36.9 C) (Oral)    Resp 18    LMP 04/13/2021 (Approximate)    SpO2 100%   Physical Exam Vitals and  nursing note reviewed. Exam conducted with a chaperone present.  Constitutional:      Appearance: Normal appearance.     Comments: Patient sitting comfortably, no active spasming or tremors noted  HENT:     Head: Normocephalic and atraumatic.  Eyes:     General: No scleral icterus.       Right eye: No discharge.        Left eye: No discharge.     Extraocular Movements: Extraocular movements intact.     Pupils: Pupils are equal, round, and reactive to light.  Cardiovascular:     Rate and Rhythm: Regular rhythm. Tachycardia present.     Pulses: Normal pulses.     Heart sounds: Normal heart sounds. No murmur heard.   No friction rub. No gallop.  Pulmonary:     Effort: Pulmonary effort is normal. No respiratory distress.     Breath sounds: Normal breath sounds.  Abdominal:     General: Abdomen is flat. Bowel sounds are normal. There is no distension.     Palpations: Abdomen is soft.     Tenderness: There is no abdominal tenderness.  Skin:    General: Skin is warm and dry.     Coloration: Skin is not jaundiced.  Neurological:     Mental Status: She is alert. Mental status is at baseline.     Coordination: Coordination normal.     Comments: Cranial nerves III through XII are grossly intact.  Grip strength is equal bilaterally, lower extremity strength equal bilaterally    ED Results / Procedures / Treatments   Labs (all labs ordered are listed, but only abnormal results are displayed) Labs Reviewed  BASIC METABOLIC PANEL  CBC WITH DIFFERENTIAL/PLATELET  URINALYSIS, ROUTINE W REFLEX MICROSCOPIC  I-STAT BETA HCG BLOOD, ED ([facility], [facility], [facility] ONLY)    EKG None  Radiology No results found.  Procedures Procedures   Medications Ordered in ED Medications  diazepam (VALIUM) injection 2.5 mg (has no administration in time range)    ED Course  I have reviewed the triage vital signs and the nursing notes.  Pertinent labs & imaging results that were available during my care of the  patient were reviewed by me and considered in my medical decision making (see chart for details).    MDM Rules/Calculators/A&P                         Stable vitals, patient nontoxic-appearing.  No focal deficits on neuro exam, no active spasming witnesses.  Patient seems like she is describing paresthesias, not musculoskeletal involvement.  She had MR done in early November, those  images were reviewed.  We will check basic labs to evaluate for electrolyte derangement as a possible cause of the there are seizures.  Do not think at this time imaging is needed. Not consistent with stroke/TIA.   Spoke with Dr. Leonel Ramsay, agrees that this is more paresthesias then muscular spasms.  He advises starting the patient on gabapentin 3 times daily.  Does not think patient needs emergent imaging at this time, in agreement that she is stable to follow-up with neurology.  Discussed with the patient, her vitals remained stable and there is no gross electrolyte derangement on her labs.  No leukocytosis or anemia.  Do not think any emergent work-up is needed at this time, will discharge patient with gabapentin.     Final Clinical Impression(s) / ED Diagnoses Final diagnoses:  None    Rx / DC Orders ED Discharge Orders     None        Sherrill Raring, [place] 04/20/21 1459    Pattricia Boss, MD 04/28/21 1536

## 2021-04-20 NOTE — Telephone Encounter (Signed)
Replied to mychart message.

## 2021-04-21 NOTE — Telephone Encounter (Signed)
I called [employer]. The PA for Vumerity does not yet have a determination. I asked them to mark it as urgent.

## 2021-04-22 ENCOUNTER — Inpatient Hospital Stay (HOSPITAL_COMMUNITY)
Admission: EM | Admit: 2021-04-22 | Discharge: 2021-04-27 | DRG: 059 | Disposition: A | Discharge status: Home / Self Care | Payer: 59 | Attending: Internal Medicine | Admitting: Internal Medicine

## 2021-04-22 ENCOUNTER — Emergency Department (HOSPITAL_COMMUNITY): Payer: 59

## 2021-04-22 ENCOUNTER — Encounter: Payer: Self-pay | Admitting: *Deleted

## 2021-04-22 ENCOUNTER — Other Ambulatory Visit: Payer: Self-pay

## 2021-04-22 DIAGNOSIS — R252 Cramp and spasm: Secondary | ICD-10-CM

## 2021-04-22 DIAGNOSIS — T420X5A Adverse effect of hydantoin derivatives, initial encounter: Secondary | ICD-10-CM | POA: Diagnosis not present

## 2021-04-22 DIAGNOSIS — Z8249 Family history of ischemic heart disease and other diseases of the circulatory system: Secondary | ICD-10-CM

## 2021-04-22 DIAGNOSIS — F419 Anxiety disorder, unspecified: Secondary | ICD-10-CM | POA: Diagnosis present

## 2021-04-22 DIAGNOSIS — E876 Hypokalemia: Secondary | ICD-10-CM | POA: Diagnosis not present

## 2021-04-22 DIAGNOSIS — M79671 Pain in right foot: Secondary | ICD-10-CM | POA: Diagnosis present

## 2021-04-22 DIAGNOSIS — I10 Essential (primary) hypertension: Secondary | ICD-10-CM | POA: Diagnosis present

## 2021-04-22 DIAGNOSIS — E538 Deficiency of other specified B group vitamins: Secondary | ICD-10-CM | POA: Diagnosis present

## 2021-04-22 DIAGNOSIS — Z20822 Contact with and (suspected) exposure to covid-19: Secondary | ICD-10-CM | POA: Diagnosis present

## 2021-04-22 DIAGNOSIS — Z888 Allergy status to other drugs, medicaments and biological substances status: Secondary | ICD-10-CM

## 2021-04-22 DIAGNOSIS — Z79899 Other long term (current) drug therapy: Secondary | ICD-10-CM

## 2021-04-22 DIAGNOSIS — G35 Multiple sclerosis: Principal | ICD-10-CM | POA: Diagnosis present

## 2021-04-22 DIAGNOSIS — E871 Hypo-osmolality and hyponatremia: Secondary | ICD-10-CM | POA: Diagnosis not present

## 2021-04-22 DIAGNOSIS — L299 Pruritus, unspecified: Secondary | ICD-10-CM | POA: Diagnosis not present

## 2021-04-22 DIAGNOSIS — M62838 Other muscle spasm: Secondary | ICD-10-CM | POA: Diagnosis not present

## 2021-04-22 DIAGNOSIS — R569 Unspecified convulsions: Secondary | ICD-10-CM | POA: Diagnosis present

## 2021-04-22 DIAGNOSIS — I959 Hypotension, unspecified: Secondary | ICD-10-CM | POA: Diagnosis present

## 2021-04-22 DIAGNOSIS — E559 Vitamin D deficiency, unspecified: Secondary | ICD-10-CM | POA: Diagnosis present

## 2021-04-22 DIAGNOSIS — K921 Melena: Secondary | ICD-10-CM | POA: Diagnosis not present

## 2021-04-22 LAB — CBC
HCT: 39.3 % (ref 36.0–46.0)
Hemoglobin: 12.8 g/dL (ref 12.0–15.0)
MCH: 30.9 pg (ref 26.0–34.0)
MCHC: 32.6 g/dL (ref 30.0–36.0)
MCV: 94.9 fL (ref 80.0–100.0)
Platelets: 386 10*3/uL (ref 150–400)
RBC: 4.14 MIL/uL (ref 3.87–5.11)
RDW: 14 % (ref 11.5–15.5)
WBC: 11.3 10*3/uL — ABNORMAL HIGH (ref 4.0–10.5)
nRBC: 0 % (ref 0.0–0.2)

## 2021-04-22 LAB — COMPREHENSIVE METABOLIC PANEL
ALT: 17 U/L (ref 0–44)
AST: 22 U/L (ref 15–41)
Albumin: 4 g/dL (ref 3.5–5.0)
Alkaline Phosphatase: 62 U/L (ref 38–126)
Anion gap: 11 (ref 5–15)
BUN: 8 mg/dL (ref 6–20)
CO2: 23 mmol/L (ref 22–32)
Calcium: 9.5 mg/dL (ref 8.9–10.3)
Chloride: 103 mmol/L (ref 98–111)
Creatinine, Ser: 0.73 mg/dL (ref 0.44–1.00)
GFR, Estimated: >60 mL/min (ref >60)
Glucose, Bld: 84 mg/dL (ref 70–99)
Potassium: 3.9 mmol/L (ref 3.5–5.1)
Sodium: 137 mmol/L (ref 135–145)
Total Bilirubin: 0.6 mg/dL (ref 0.3–1.2)
Total Protein: 7.6 g/dL (ref 6.5–8.1)

## 2021-04-22 LAB — RESP PANEL BY RT-PCR (FLU A&B, COVID) ARPGX2
Influenza A by PCR: NEGATIVE
Influenza B by PCR: NEGATIVE
SARS Coronavirus 2 by RT PCR: NEGATIVE

## 2021-04-22 LAB — I-STAT BETA HCG BLOOD, ED (MC, WL, AP ONLY): I-stat hCG, quantitative: <5 m[IU]/mL (ref <5)

## 2021-04-22 MED ORDER — ONDANSETRON HCL 4 MG/2ML IJ SOLN: 4.0000 mg | Freq: Four times a day (QID) | INTRAMUSCULAR | Status: DC | PRN

## 2021-04-22 MED ORDER — ENOXAPARIN SODIUM 40 MG/0.4ML IJ SOSY
40.0000 mg | PREFILLED_SYRINGE | INTRAMUSCULAR | Status: DC
  Administered 2021-04-22 – 2021-04-26 (×5): 40 mg via SUBCUTANEOUS
  Filled 2021-04-22 (×5): qty 0.4

## 2021-04-22 MED ORDER — ACETAMINOPHEN 325 MG PO TABS: 650.0000 mg | ORAL_TABLET | Freq: Four times a day (QID) | ORAL | Status: DC | PRN

## 2021-04-22 MED ORDER — LORAZEPAM 2 MG/ML IJ SOLN
0.5000 mg | Freq: Once | INTRAMUSCULAR | Status: AC
  Administered 2021-04-22: 17:00:00 0.5 mg via INTRAVENOUS
  Filled 2021-04-22: qty 1

## 2021-04-22 MED ORDER — ACETAMINOPHEN 650 MG RE SUPP: 650.0000 mg | Freq: Four times a day (QID) | RECTAL | Status: DC | PRN

## 2021-04-22 MED ORDER — DIAZEPAM 5 MG PO TABS
5.0000 mg | ORAL_TABLET | Freq: Once | ORAL | Status: AC
  Administered 2021-04-22: 13:00:00 5 mg via ORAL
  Filled 2021-04-22: qty 1

## 2021-04-22 MED ORDER — ONDANSETRON HCL 4 MG PO TABS: 4.0000 mg | ORAL_TABLET | Freq: Four times a day (QID) | ORAL | Status: DC | PRN

## 2021-04-22 MED ORDER — HYDROMORPHONE HCL 1 MG/ML IJ SOLN
0.5000 mg | INTRAMUSCULAR | Status: DC | PRN
  Administered 2021-04-23: 05:00:00 0.5 mg via INTRAVENOUS
  Filled 2021-04-22: qty 1

## 2021-04-22 MED ORDER — GADOBUTROL 1 MMOL/ML IV SOLN
6.0000 mL | Freq: Once | INTRAVENOUS | Status: AC | PRN
  Administered 2021-04-22: 18:00:00 6 mL via INTRAVENOUS

## 2021-04-22 NOTE — Telephone Encounter (Signed)
Samples provided: Vumerity 231mg  capx3 boxes. Lot: . Expiration: 10/2021.

## 2021-04-22 NOTE — ED Provider Notes (Signed)
Emergency Medicine Provider Triage Evaluation Note  Janice Rose , a 40 y.o. female  was evaluated in triage.  Pt complains of right-sided leg, arm, facial spasms.  Symptoms can occur every hour or every couple of hours.  There is severe when they occur.  She is taking gabapentin without improvement.  Symptoms started about 4 to 5 days ago.  She also developed mild headache and a burning sensation to the left occipital area.  Review of Systems  Positive: Muscle spasms Negative: Weakness  Physical Exam  BP 125/85 (BP Location: Right Arm)    Pulse (!) 114    Temp 98.3 F (36.8 C) (Oral)    Resp 16    LMP 04/13/2021 (Approximate)    SpO2 96%  Gen:   Awake, no distress   Resp:  Normal effort  MSK:   Moves extremities without difficulty  Other:  I saw the patient during 1 of these episodes in triage.  She was in distress, holding her wrist and leg.  Severe spasticity noted.  After about 30 seconds, symptoms gradually resolved and arm and leg relaxed.   Medical Decision Making  Medically screening exam initiated at 1:10 PM.  Appropriate orders placed.  Janice Rose was informed that the remainder of the evaluation will be completed by another provider, this initial triage assessment does not replace that evaluation, and the importance of remaining in the ED until their evaluation is complete.     Renne Crigler, PA-C 04/22/21 1312    Gloris Manchester, MD 04/22/21 2213

## 2021-04-22 NOTE — ED Notes (Signed)
Patient transported to MRI 

## 2021-04-22 NOTE — H&P (Addendum)
History and Physical    Janice Rose [id number] DOB: 06/11/1980 DOA: 04/22/2021  PCP: Lucianne Lei, MD  Patient coming from: Home  I have personally briefly reviewed patient's old medical records in [facility]  Chief Complaint: Right-sided muscle spasms  HPI: Janice Rose is a 40 y.o. female with medical history significant for multiple sclerosis who presented to the ED for evaluation of right-sided muscle spasms.  Patient recently admitted to 03/17/2021-03/22/2021 for right-sided paresthesias felt due to multiple sclerosis flare.  She was previously in remission for 14 years.  She was seen by neurology and treated with high-dose Solu-Medrol, completing 5 days.  She was discharged with outpatient neurology follow-up.  On 04/18/2021 she developed new spasms involving her right face, right arm, and right leg.  She says the spasms will cause her right arm to contract medially in the flexed position and cause her right leg to contract in a fully extended position.  She does not correlate the symptoms with any particular exacerbating factor however seems to occur when she is more active and occurs less when she is resting and keeps her right leg in a flexed position crossed underneath her other leg.  She went to the ED on 12/19 due to persistent symptoms.  Case was discussed with neurology who felt symptoms were more paresthesias rather than muscular spasms.  She was started on gabapentin 3 times daily.  Patient states she did start taking gabapentin however she says her symptoms are more frequent and more pronounced since he started taking gabapentin.  She has had 3 more episodes while waiting in the ED.  Patient states that during these episodes she is fully aware and able to answer questions and follow commands.  She however does have some feelings of "cloudy brain" immediately after her episodes.  ED Course:  Initial vitals showed BP 125/85, pulse 114, RR 16, temp 98.3 F, SPO2  96% on room air.  Labs show WBC 11.3, hemoglobin 12.8, platelets 386,000, sodium 137, potassium 3.9, bicarb 23, BUN 8, creatinine 0.73, serum glucose 84, LFTs within normal limits, i-STAT beta-hCG <5.0.  Respiratory panel ordered and pending collection.  EDP discussed with on-call neurology who recommended MRI imaging.  MRI brain with and without contrast showed decreased size of the tumefactive demyelinating lesion in the left centrum semiovale with nearly resolved enhancement since 03/14/2021.  Scattered additional demyelinating lesions in both cerebral hemispheres are unchanged.  No new abnormal enhancement seen.  MRI cervical spine with and without contrast negative for evidence of cord signal abnormality.  No abnormal enhancement to suggest active demyelination noted.  Neurology recommended admission for continuous EEG and will consult.  The hospitalist service was consulted to admit for further evaluation and management.  Review of Systems: All systems reviewed and are negative except as documented in history of present illness above.   Past Medical History:  Diagnosis Date   MS (multiple sclerosis) (HCC)     Past Surgical History:  Procedure Laterality Date   none      Social History:  reports that she has never smoked. She has never used smokeless tobacco. She reports current alcohol use. She reports that she does not use drugs.  No Known Allergies  Family History  Problem Relation Age of Onset   Hypertension Mother    Heart disease Father    Hypertension Father    Diabetes Father    Multiple sclerosis Neg Hx      Prior to Admission medications  Medication Sig Start Date End Date Taking? Authorizing Provider  cyanocobalamin (,VITAMIN B-12,) 1000 MCG/ML injection Inject 1 cc monthly for 6 months.  Please dispense with #6 1 cc syringes and #6 27 g 1" needles 03/25/21  Yes Sater, Pearletha Furl, MD  gabapentin (NEURONTIN) 300 MG capsule Take 1 capsule (300 mg total) by  mouth 3 (three) times daily. 04/20/21  Yes Theron Arista, PA-C  Vitamin D, Ergocalciferol, (DRISDOL) 1.25 MG (50000 UNIT) CAPS capsule Take 1 capsule (50,000 Units total) by mouth every 7 (seven) days. 03/25/21  Yes Sater, Pearletha Furl, MD  cephALEXin (KEFLEX) 500 MG capsule Take 500 mg by mouth 4 (four) times daily. Patient not taking: Reported on 04/22/2021 04/09/21   [provider]  Dimethyl Fumarate 240 MG CPDR SMARTSIG:1 Capsule(s) By Mouth Morning-Evening Patient not taking: Reported on 04/22/2021 04/19/21   [provider]  Diroximel Fumarate (VUMERITY) 231 MG CPDR Titration: 231mg  po BIDx7days then 462mg  (231mg x2) po BID x23days Maintenance: 462mg  (231mg x2) po BID Patient not taking: Reported on 04/22/2021 04/20/21   Sater, Pearletha Furl, MD  doxycycline (VIBRAMYCIN) 100 MG capsule Take 100 mg by mouth 2 (two) times daily. Patient not taking: Reported on 04/22/2021 04/09/21   [provider]  ibuprofen (ADVIL) 200 MG tablet Take 600 mg by mouth every 6 (six) hours as needed for moderate pain or headache. Patient not taking: Reported on 03/31/2021    [provider]  pantoprazole (PROTONIX) 40 MG tablet Take 1 tablet (40 mg total) by mouth 2 (two) times daily before a meal. Patient not taking: Reported on 03/31/2021 03/22/21   Alberteen Sam, MD  predniSONE (DELTASONE) 10 MG tablet Take 50 mg (5 tabs) for 1 day then take 40 mg (4 tabs) for 1 day then take 30 mg (3 tabs) for 1 day then take 20 mg for 1 day then take 10 mg then stop Patient not taking: Reported on 03/25/2021 03/22/21   Alberteen Sam, MD    Physical Exam: Vitals:   04/22/21 1625 04/22/21 1630 04/22/21 1827 04/22/21 1945  BP: 110/66 101/79 (!) 96/56 97/63  Pulse: 96 99 87 78  Resp:  13 16 18   Temp:      TempSrc:      SpO2: 100% 100% 100% 99%   Constitutional: Resting supine in bed, NAD, calm, comfortable Eyes: PERRL, lids and conjunctivae normal ENMT: Mucous membranes are  moist. Posterior pharynx clear of any exudate or lesions.Normal dentition.  Neck: normal, supple, no masses. Respiratory: clear to auscultation bilaterally, no wheezing, no crackles. Normal respiratory effort. No accessory muscle use.  Cardiovascular: Regular rate and rhythm, no murmurs / rubs / gallops. No extremity edema. 2+ pedal pulses. Abdomen: no tenderness, no masses palpated. No hepatosplenomegaly. Bowel sounds positive.  Musculoskeletal: no clubbing / cyanosis. No joint deformity upper and lower extremities. Good ROM, no contractures. Normal muscle tone.  Skin: no rashes, lesions, ulcers. No induration Neurologic: CN 2-12 grossly intact. Sensation intact. Strength 5/5 in all 4.  Psychiatric: Normal judgment and insight. Alert and oriented x 3. Normal mood.   Labs on Admission: I have personally reviewed following labs and imaging studies  CBC: Recent Labs  Lab 04/20/21 1009 04/22/21 1310  WBC 9.7 11.3*  NEUTROABS 6.3  --   HGB 12.7 12.8  HCT 39.0 39.3  MCV 94.9 94.9  PLT 402* 386   Basic Metabolic Panel: Recent Labs  Lab 04/20/21 1009 04/22/21 1310  NA 137 137  K 4.2 3.9  CL 105 103  CO2 24 23  GLUCOSE 111* 84  BUN 7 8  CREATININE 0.56 0.73  CALCIUM 9.3 9.5   GFR: CrCl cannot be calculated (Unknown ideal weight.). Liver Function Tests: Recent Labs  Lab 04/22/21 1310  AST 22  ALT 17  ALKPHOS 62  BILITOT 0.6  PROT 7.6  ALBUMIN 4.0   No results for input(s): LIPASE, AMYLASE in the last 168 hours. No results for input(s): AMMONIA in the last 168 hours. Coagulation Profile: No results for input(s): INR, PROTIME in the last 168 hours. Cardiac Enzymes: No results for input(s): CKTOTAL, CKMB, CKMBINDEX, TROPONINI in the last 168 hours. BNP (last 3 results) No results for input(s): PROBNP in the last 8760 hours. HbA1C: No results for input(s): HGBA1C in the last 72 hours. CBG: No results for input(s): GLUCAP in the last 168 hours. Lipid Profile: No  results for input(s): CHOL, HDL, LDLCALC, TRIG, CHOLHDL, LDLDIRECT in the last 72 hours. Thyroid Function Tests: No results for input(s): TSH, T4TOTAL, FREET4, T3FREE, THYROIDAB in the last 72 hours. Anemia Panel: No results for input(s): VITAMINB12, FOLATE, FERRITIN, TIBC, IRON, RETICCTPCT in the last 72 hours. Urine analysis:    Component Value Date/Time   COLORURINE STRAW (A) 03/17/2021 1606   APPEARANCEUR CLEAR 03/17/2021 1606   LABSPEC 1.011 03/17/2021 1606   PHURINE 7.0 03/17/2021 1606   GLUCOSEU NEGATIVE 03/17/2021 1606   HGBUR SMALL (A) 03/17/2021 1606   BILIRUBINUR NEGATIVE 03/17/2021 1606   KETONESUR NEGATIVE 03/17/2021 1606   PROTEINUR NEGATIVE 03/17/2021 1606   NITRITE NEGATIVE 03/17/2021 1606   LEUKOCYTESUR NEGATIVE 03/17/2021 1606    Radiological Exams on Admission: MR Brain W and Wo Contrast  Result Date: 04/22/2021 CLINICAL DATA:  Multiple sclerosis, severe muscle spasms of the right upper and lower extremities, right face. EXAM: MRI HEAD WITHOUT AND WITH CONTRAST TECHNIQUE: Multiplanar, multiecho pulse sequences of the brain and surrounding structures were obtained without and with intravenous contrast. CONTRAST:  74mL GADAVIST GADOBUTROL 1 MMOL/ML IV SOLN COMPARISON:  Brain MRI 03/14/2021 FINDINGS: Brain: Again seen is confluent FLAIR hyperintensity in the left corona radiata today measuring up to approximately 2.8 cm TV by 2.2 cm AP, decreased in size from 3.8 cm TV by 3.0 cm AP measured at a similar level. Previously seen incomplete ring enhancement of this lesion has markedly improved though not entirely resolved (23-19). Numerous additional foci of FLAIR signal abnormality in the juxtacortical and periventricular white matter in both cerebral hemispheres are not significantly changed. There is no new abnormal enhancement. Parenchymal volume is normal. The ventricles are normal in size. There is no evidence of acute intracranial hemorrhage, extra-axial fluid collection,  or acute infarct. There is no new mass lesion.  There is no midline shift. Vascular: Normal flow voids. Skull and upper cervical spine: Normal marrow signal. Sinuses/Orbits: The paranasal sinuses are clear. The globes and orbits are unremarkable. The optic nerves are grossly unremarkable on these nondedicated sequences. Other: None. IMPRESSION: 1. Decreased size of the tumefactive demyelinating lesion in the left centrum semiovale with nearly resolved enhancement since 03/14/2021. 2. Scattered additional demyelinating lesions in both cerebral hemispheres are unchanged. 3. No new abnormal enhancement. Electronically Signed   By: Lesia Hausen M.D.   On: 04/22/2021 18:20   MR Cervical Spine W or Wo Contrast  Result Date: 04/22/2021 CLINICAL DATA:  Multiple sclerosis, new weakness, muscle spasms of right upper and lower extremities and right base EXAM: MRI CERVICAL SPINE WITHOUT AND WITH CONTRAST TECHNIQUE: Multiplanar and multiecho pulse sequences of the cervical  spine, to include the craniocervical junction and cervicothoracic junction, were obtained without and with intravenous contrast. CONTRAST:  81mL GADAVIST GADOBUTROL 1 MMOL/ML IV SOLN COMPARISON:  Cervical spine MRI 03/18/2021 FINDINGS: Alignment: There is slight reversal of the normal cervical spine lordosis. There is no antero or retrolisthesis. Vertebrae: Vertebral body heights are preserved. There is no marrow signal abnormality. There is no abnormal marrow enhancement. Cord: There is no convincing evidence of cord signal abnormality. There is no abnormal cord enhancement. Posterior Fossa, vertebral arteries, paraspinal tissues: The posterior fossa is assessed on the separately dictated brain MRI. The vertebral artery flow voids are present. The paraspinal soft tissues are unremarkable. Disc levels: There is mild degenerative change in the cervical spine, most advanced at C5-C6, unchanged since the recent cervical spine MRI. IMPRESSION: No convincing  evidence of cord signal abnormality. No abnormal enhancement to suggest active demyelination. Electronically Signed   By: Lesia Hausen M.D.   On: 04/22/2021 18:24    EKG: Personally reviewed. Normal sinus rhythm without acute ischemic changes.  No prior for comparison.  Assessment/Plan Principal Problem:   Muscle spasm Active Problems:   Multiple sclerosis (HCC)   Janice Rose is a 40 y.o. female with medical history significant for multiple sclerosis who is admitted for evaluation of episodic right-sided muscle spasms/contractures.  Right-sided muscle spasms Multiple sclerosis: Patient presenting with 4 days episodic right-sided muscle spasms/contractures.  Recent admit for right-sided paresthesias due to multiple sclerosis flareup s/p 5 days high-dose steroids.  MRI brain shows decreased size of previously seen demyelinating lesion.  MR cervical spine without acute abnormality. -Neurology consulted, appreciate further assistance -Continuous EEG ordered -Continue gabapentin 300 mg 3 times daily per neurology  DVT prophylaxis: Lovenox Code Status: Full code, confirmed on admission Family Communication: Discussed with patient, she has discussed with family Disposition Plan: From home and likely discharge to home pending clinical progress Consults called: Neurology Level of care: Telemetry Medical Admission status:  Status is: Observation  The patient remains OBS appropriate and will d/c before 2 midnights.  Darreld Mclean MD [facility]  If 7PM-7AM, please contact night-coverage [email or web address]  04/22/2021, 11:23 PM

## 2021-04-22 NOTE — ED Triage Notes (Signed)
Pt reports increase severe right muscle spasms of RUE, RLE, and R side of face. Per pt this started 04/19/21 and happens multiple times per day. Pt followed by Neurology for MS.    PMH: MS

## 2021-04-22 NOTE — Consult Note (Signed)
NEUROLOGY CONSULTATION NOTE   Date of service: April 22, 2021 Patient Name: Janice Rose MRN:  [id number] DOB:  Jan 06, 1981 Reason for consult: "R sided spasms" Requesting Provider: Cheryll Cockayne, MD _ _ _   _ __   _ __ _ _  __ __   _ __   __ _  History of Present Illness  Janice Rose is a 40 y.o. female with PMH significant for MS not on disease modifying treatment at this time and waiting for prior auth to start a medication who presents with several episodes of R sided spasms involving RUE, RLE and R face.   She was fine all day Saturday, felt a spasm in her R leg later in the day. Woke up on Sunday with an episode of R sided spasm. Warning sign at the onset includes pain or cramp in R foot/leg for a couple secs or just a sensation of one in her head. Progresses to her RLE "stretched out and pulling down" along with her RUE drawn closer to her body. Reports R arm and leg is really tensed up and her joints hurt. This lasts 15 to 30 secs and then let go. The episodes tend to cluster together. She is completely awake and aware during the episodes. Seems like they wake her up from sleep. Crossing her legs can trigger it. If she moves her R leg really fast right after the episode, that can trigger it too. Had about 4 episodes on Sunday and they have increased in frequency and today had about 7 episodes already before 7PM. After the episode she does not explicitly endorse any weakness but feels like her R side is sore and exhausted like she just got back from the gym. She is hesitant to move it as it can trigger another episode.  No loss of bowel or bladder, no recent significant head injury with LOC. She feels like her recent tumefactive MS lesion also affected her R side and the episodes seem to happen in the same distribution.  She initially presented to [facility] on 12/19 and case was discussed with our team and she was started on Gabapentin. She returns as the Gabapentin has not helped and these  have worsened since she started Gabapentin so she presented again.  Workup with MRI Brain, C spine with and without contrast with decrease in the size of tumefactive lesion with nearly resolved enhancement compared to her prior MRI on 03/14/21. She does not have any cervical spinal cord lesions.    ROS   Constitutional Denies weight loss, fever and chills.   HEENT Denies changes in vision and hearing.   Respiratory Denies SOB and cough.   CV Denies palpitations and CP   GI Denies abdominal pain, nausea, vomiting and diarrhea.   GU Denies dysuria and urinary frequency.   MSK Endorse myalgia and joint pain.   Skin Denies rash and pruritus.  Neurological Denies headache and syncope.  Psychiatric Denies recent changes in mood. Denies anxiety and depression.   Past History   Past Medical History:  Diagnosis Date   MS (multiple sclerosis) (HCC)    Past Surgical History:  Procedure Laterality Date   none     Family History  Problem Relation Age of Onset   Hypertension Mother    Heart disease Father    Hypertension Father    Diabetes Father    Multiple sclerosis Neg Hx    Social History   Socioeconomic History   Marital status: Married  Spouse name: Not on file   Number of children: Not on file   Years of education: Not on file   Highest education level: Not on file  Occupational History   Not on file  Tobacco Use   Smoking status: Never   Smokeless tobacco: Never  Vaping Use   Vaping Use: Never used  Substance and Sexual Activity   Alcohol use: Yes    Comment: occasional   Drug use: Never   Sexual activity: Not on file  Other Topics Concern   Not on file  Social History Narrative   Right handed   Apartment down stairs   Drinks caffeine   Social Determinants of Health   Financial Resource Strain: Not on file  Food Insecurity: Not on file  Transportation Needs: Not on file  Physical Activity: Not on file  Stress: Not on file  Social  Connections: Not on file   No Known Allergies  Medications  (Not in a hospital admission)    Vitals   Vitals:   04/22/21 1625 04/22/21 1630 04/22/21 1827 04/22/21 1945  BP: 110/66 101/79 (!) 96/56 97/63  Pulse: 96 99 87 78  Resp:  13 16 18   Temp:      TempSrc:      SpO2: 100% 100% 100% 99%     There is no height or weight on file to calculate BMI.  Physical Exam   General: Laying comfortably in bed; in no acute distress.  HENT: Normal oropharynx and mucosa. Normal external appearance of ears and nose.  Neck: Supple, no pain or tenderness  CV: No JVD. No peripheral edema.  Pulmonary: Symmetric Chest rise. Normal respiratory effort.  Abdomen: Soft to touch, non-tender.  Ext: No cyanosis, edema, or deformity  Skin: No rash. Normal palpation of skin.   Musculoskeletal: Normal digits and nails by inspection. No clubbing.   Neurologic Examination  Mental status/Cognition: Alert, oriented to self, place, month and year, good attention.  Speech/language: Fluent, comprehension intact, object naming intact, repetition intact.  Cranial nerves:   CN II Pupils equal and reactive to light, no VF deficits    CN III,IV,VI EOM intact, no gaze preference or deviation, no nystagmus    CN V normal sensation in V1, V2, and V3 segments bilaterally    CN VII no asymmetry, no nasolabial fold flattening   CN VIII normal hearing to speech    CN IX & X normal palatal elevation, no uvular deviation    CN XI 5/5 head turn and 5/5 shoulder shrug bilaterally    CN XII midline tongue protrusion    Motor:  Muscle bulk: normal, tone normal, pronator drift none tremor none Mvmt Root Nerve  Muscle Right Left Comments  SA C5/6 Ax Deltoid 5 5   EF C5/6 Mc Biceps 5 5   EE C6/7/8 Rad Triceps 5 5   WF C6/7 Med FCR     WE C7/8 PIN ECU     F Ab C8/T1 U ADM/FDI 5 5   HF L1/2/3 Fem Illopsoas 5 5   KE L2/3/4 Fem Quad 5 5   DF L4/5 D Peron Tib Ant 5 5   PF S1/2 Tibial Grc/Sol 4+ 5 Hurts when I touch her  toes on the right foot   Reflexes:  Right Left Comments  Pectoralis      Biceps (C5/6) 2 2   Brachioradialis (C5/6) 2 2    Triceps (C6/7) 2 2    Patellar (L3/4) 3 3  Achilles (S1) 2+ 2+    Hoffman      Plantar mute mute   Jaw jerk    Sensation:  Light touch Intact throughout   Pin prick    Temperature    Vibration   Proprioception    Coordination/Complex Motor:  - Finger to Nose intact BL - Heel to shin mild ataxia in RLE - Rapid alternating movement are mildly slowed in RUE - Gait: deferred for patient safety.  Labs   CBC:  Recent Labs  Lab 04/20/21 1009 04/22/21 1310  WBC 9.7 11.3*  NEUTROABS 6.3  --   HGB 12.7 12.8  HCT 39.0 39.3  MCV 94.9 94.9  PLT 402* 386    Basic Metabolic Panel:  Lab Results  Component Value Date   NA 137 04/22/2021   K 3.9 04/22/2021   CO2 23 04/22/2021   GLUCOSE 84 04/22/2021   BUN 8 04/22/2021   CREATININE 0.73 04/22/2021   CALCIUM 9.5 04/22/2021   GFRNONAA >60 04/22/2021   Lipid Panel: No results found for: LDLCALC HgbA1c: No results found for: HGBA1C Urine Drug Screen: No results found for: LABOPIA, COCAINSCRNUR, LABBENZ, AMPHETMU, THCU, LABBARB  Alcohol Level No results found for: [facility]  MR C spine with and without contrast(personally reviewed): No T2/FLAIR lesions, no enhancement, no cord compression.  MRI Brain with and without contrast(Personally reviewed): The left centrum semiovale lesion is smaller in size compared to MRI from 03/14/21. Very faint enhancement. Multiple scattered T2/FLAIR lesions in BL hemispheres that are stable in size compared to prior MRI fonr 03/14/21 with no enhancement.  cEEG:  pending  Impression   NEAL OSHEA is a 40 y.o. female with PMH significant for MS not on disease modifying treatment at this time and waiting for prior auth to start a medication who presents with several episodes of R sided spasms involving RUE, RLE and R face. The description/short duration of these episodes  along with it semiology is concerning for potential seizures vs spasms.  Will continue Gabapentin and put her up on cEEG to characterize these events. She is having multiple events a day and there is a good chance we will be able to capture these on cEEG.  Impression: Tumefactive MS R sided spasms Rule out seizures  Recommendations  - cEEG - Continue Gabapentin 300mg  TID - Will do a couple doses of PRN opiods to help with pain associated with these episodes. - Seizure precautions with seizure pads. _____________________________________________________________________  Plan discussed with patient and her husband at the bedside and all questions answered. Plan also discussed with Dr. over secure chat.   Thank you for the opportunity to take part in the care of this patient. If you have any further questions, please contact the neurology consultation attending.  Signed,  Allena Katz [facility] Pager Number Erick Blinks _ _ _   _ __   _ __ _ _  __ __   _ __   __ _

## 2021-04-22 NOTE — ED Provider Notes (Signed)
[facility] EMERGENCY DEPARTMENT Provider Note   CSN: [id number] Arrival date & time: 04/22/21  1240     History Chief Complaint  Patient presents with   Spasms    Janice Rose is a 40 y.o. female.  Pt reports she is having episodes of spasm to her right arm and right leg.  Pt reports symptoms began 4 days ago.  Pt reports she has a tightness and pain in right arm and leg.  Pt reports it last for 20-30 seconds.  Pt is able to talk during episodes.  Pt has MS.  Pt was hospitalized in November and given high dose steroids.    The history is provided by the patient. No language interpreter was used.  Neurologic Problem This is a new problem. Episode onset: 4 days. The problem has not changed since onset.Nothing aggravates the symptoms. Nothing relieves the symptoms. She has tried nothing for the symptoms. The treatment provided no relief.      Past Medical History:  Diagnosis Date   MS (multiple sclerosis) [facility])     Patient Active Problem List   Diagnosis Date Noted   High risk medication use 03/25/2021   Right hand weakness 03/25/2021   Numbness 03/25/2021   Carbuncle 03/21/2021   Normocytic anemia 03/21/2021   Otalgia, left 03/21/2021   Multiple sclerosis (HCC) 03/18/2021    Past Surgical History:  Procedure Laterality Date   none       OB History   No obstetric history on file.     Family History  Problem Relation Age of Onset   Hypertension Mother    Heart disease Father    Hypertension Father    Diabetes Father    Multiple sclerosis Neg Hx     Social History   Tobacco Use   Smoking status: Never   Smokeless tobacco: Never  Vaping Use   Vaping Use: Never used  Substance Use Topics   Alcohol use: Yes    Comment: occasional   Drug use: Never    Home Medications Prior to Admission medications   Medication Sig Start Date End Date Taking? Authorizing Provider  cyanocobalamin (,VITAMIN B-12,) 1000 MCG/ML injection Inject 1 cc  monthly for 6 months.  Please dispense with #6 1 cc syringes and #6 27 g 1" needles 03/25/21  Yes Sater, Pearletha Furl, MD  gabapentin (NEURONTIN) 300 MG capsule Take 1 capsule (300 mg total) by mouth 3 (three) times daily. 04/20/21  Yes Theron Arista, PA-C  Vitamin D, Ergocalciferol, (DRISDOL) 1.25 MG (50000 UNIT) CAPS capsule Take 1 capsule (50,000 Units total) by mouth every 7 (seven) days. 03/25/21  Yes Sater, Pearletha Furl, MD  cephALEXin (KEFLEX) 500 MG capsule Take 500 mg by mouth 4 (four) times daily. Patient not taking: Reported on 04/22/2021 04/09/21   [provider]  Dimethyl Fumarate 240 MG CPDR SMARTSIG:1 Capsule(s) By Mouth Morning-Evening Patient not taking: Reported on 04/22/2021 04/19/21   [provider]  Diroximel Fumarate (VUMERITY) 231 MG CPDR Titration: 231mg  po BIDx7days then 462mg  (231mg x2) po BID x23days Maintenance: 462mg  (231mg x2) po BID Patient not taking: Reported on 04/22/2021 04/20/21   Sater, , MD  doxycycline (VIBRAMYCIN) 100 MG capsule Take 100 mg by mouth 2 (two) times daily. Patient not taking: Reported on 04/22/2021 04/09/21   [provider]  ibuprofen (ADVIL) 200 MG tablet Take 600 mg by mouth every 6 (six) hours as needed for moderate pain or headache. Patient not taking: Reported on 03/31/2021  [provider]  pantoprazole (PROTONIX) 40 MG tablet Take 1 tablet (40 mg total) by mouth 2 (two) times daily before a meal. Patient not taking: Reported on 03/31/2021 03/22/21   Alberteen Sam, MD  predniSONE (DELTASONE) 10 MG tablet Take 50 mg (5 tabs) for 1 day then take 40 mg (4 tabs) for 1 day then take 30 mg (3 tabs) for 1 day then take 20 mg for 1 day then take 10 mg then stop Patient not taking: Reported on 03/25/2021 03/22/21   Alberteen Sam, MD    Allergies    Patient has no known allergies.  Review of Systems   Review of Systems  All other systems reviewed and are negative.  Physical  Exam Updated Vital Signs BP 97/63    Pulse 78    Temp 98.3 F (36.8 C) (Oral)    Resp 18    LMP 04/13/2021 (Approximate)    SpO2 99%   Physical Exam Vitals reviewed.  HENT:     Nose: Nose normal.     Mouth/Throat:     Mouth: Mucous membranes are moist.  Eyes:     Extraocular Movements: Extraocular movements intact.     Pupils: Pupils are equal, round, and reactive to light.  Cardiovascular:     Rate and Rhythm: Normal rate.  Abdominal:     General: Abdomen is flat.  Musculoskeletal:     Cervical back: Normal range of motion.  Skin:    General: Skin is warm.  Neurological:     General: No focal deficit present.     Mental Status: She is alert.  Psychiatric:        Mood and Affect: Mood normal.    ED Results / Procedures / Treatments   Labs (all labs ordered are listed, but only abnormal results are displayed) Labs Reviewed  CBC - Abnormal; Notable for the following components:      Result Value   WBC 11.3 (*)    All other components within normal limits  RESP PANEL BY RT-PCR (FLU A&B, COVID) ARPGX2  COMPREHENSIVE METABOLIC PANEL  I-STAT BETA HCG BLOOD, ED (MC, WL, AP ONLY)    EKG None  Radiology MR Brain W and Wo Contrast  Result Date: 04/22/2021 CLINICAL DATA:  Multiple sclerosis, severe muscle spasms of the right upper and lower extremities, right face. EXAM: MRI HEAD WITHOUT AND WITH CONTRAST TECHNIQUE: Multiplanar, multiecho pulse sequences of the brain and surrounding structures were obtained without and with intravenous contrast. CONTRAST:  74mL GADAVIST GADOBUTROL 1 MMOL/ML IV SOLN COMPARISON:  Brain MRI 03/14/2021 FINDINGS: Brain: Again seen is confluent FLAIR hyperintensity in the left corona radiata today measuring up to approximately 2.8 cm TV by 2.2 cm AP, decreased in size from 3.8 cm TV by 3.0 cm AP measured at a similar level. Previously seen incomplete ring enhancement of this lesion has markedly improved though not entirely resolved (23-19). Numerous  additional foci of FLAIR signal abnormality in the juxtacortical and periventricular white matter in both cerebral hemispheres are not significantly changed. There is no new abnormal enhancement. Parenchymal volume is normal. The ventricles are normal in size. There is no evidence of acute intracranial hemorrhage, extra-axial fluid collection, or acute infarct. There is no new mass lesion.  There is no midline shift. Vascular: Normal flow voids. Skull and upper cervical spine: Normal marrow signal. Sinuses/Orbits: The paranasal sinuses are clear. The globes and orbits are unremarkable. The optic nerves are grossly unremarkable on these nondedicated sequences. Other:  None. IMPRESSION: 1. Decreased size of the tumefactive demyelinating lesion in the left centrum semiovale with nearly resolved enhancement since 03/14/2021. 2. Scattered additional demyelinating lesions in both cerebral hemispheres are unchanged. 3. No new abnormal enhancement. Electronically Signed   By: Lesia Hausen M.D.   On: 04/22/2021 18:20   MR Cervical Spine W or Wo Contrast  Result Date: 04/22/2021 CLINICAL DATA:  Multiple sclerosis, new weakness, muscle spasms of right upper and lower extremities and right base EXAM: MRI CERVICAL SPINE WITHOUT AND WITH CONTRAST TECHNIQUE: Multiplanar and multiecho pulse sequences of the cervical spine, to include the craniocervical junction and cervicothoracic junction, were obtained without and with intravenous contrast. CONTRAST:  46mL GADAVIST GADOBUTROL 1 MMOL/ML IV SOLN COMPARISON:  Cervical spine MRI 03/18/2021 FINDINGS: Alignment: There is slight reversal of the normal cervical spine lordosis. There is no antero or retrolisthesis. Vertebrae: Vertebral body heights are preserved. There is no marrow signal abnormality. There is no abnormal marrow enhancement. Cord: There is no convincing evidence of cord signal abnormality. There is no abnormal cord enhancement. Posterior Fossa, vertebral arteries,  paraspinal tissues: The posterior fossa is assessed on the separately dictated brain MRI. The vertebral artery flow voids are present. The paraspinal soft tissues are unremarkable. Disc levels: There is mild degenerative change in the cervical spine, most advanced at C5-C6, unchanged since the recent cervical spine MRI. IMPRESSION: No convincing evidence of cord signal abnormality. No abnormal enhancement to suggest active demyelination. Electronically Signed   By: Lesia Hausen M.D.   On: 04/22/2021 18:24    Procedures Procedures   Medications Ordered in ED Medications  diazepam (VALIUM) tablet 5 mg (5 mg Oral Given 04/22/21 1309)  LORazepam (ATIVAN) injection 0.5 mg (0.5 mg Intravenous Given 04/22/21 1635)  gadobutrol (GADAVIST) 1 MMOL/ML injection 6 mL (6 mLs Intravenous Contrast Given 04/22/21 1742)    ED Course  I have reviewed the triage vital signs and the nursing notes.  Pertinent labs & imaging results that were available during my care of the patient were reviewed by me and considered in my medical decision making (see chart for details).    MDM Rules/Calculators/A&P                         MDM:  I discussed pt with Dr. Amada Jupiter.  MRi obtained  and reviewed. I discussed with Dr. Derry Lory  Neurology who advised hospitalist admission for EEG.  Pt has had 3 episodes while in ED.  (Pt had tensing of right arm, straightening of right leg)  I spoke to Dr. Allena Katz hospitalist who will admit    Final Clinical Impression(s) / ED Diagnoses Final diagnoses:  Muscle spasm  Multiple sclerosis [facility])    Rx / DC Orders ED Discharge Orders     None        Osie Cheeks 04/22/21 2026    Cheryll Cockayne, MD 04/30/21 2030

## 2021-04-23 ENCOUNTER — Encounter (HOSPITAL_COMMUNITY): Payer: Self-pay | Admitting: Internal Medicine

## 2021-04-23 ENCOUNTER — Observation Stay (HOSPITAL_COMMUNITY): Payer: 59

## 2021-04-23 DIAGNOSIS — R252 Cramp and spasm: Secondary | ICD-10-CM | POA: Diagnosis not present

## 2021-04-23 DIAGNOSIS — Z20822 Contact with and (suspected) exposure to covid-19: Secondary | ICD-10-CM | POA: Diagnosis present

## 2021-04-23 DIAGNOSIS — M79671 Pain in right foot: Secondary | ICD-10-CM | POA: Diagnosis present

## 2021-04-23 DIAGNOSIS — I10 Essential (primary) hypertension: Secondary | ICD-10-CM | POA: Diagnosis present

## 2021-04-23 DIAGNOSIS — M62838 Other muscle spasm: Secondary | ICD-10-CM | POA: Diagnosis present

## 2021-04-23 DIAGNOSIS — E538 Deficiency of other specified B group vitamins: Secondary | ICD-10-CM | POA: Diagnosis present

## 2021-04-23 DIAGNOSIS — Z8249 Family history of ischemic heart disease and other diseases of the circulatory system: Secondary | ICD-10-CM | POA: Diagnosis not present

## 2021-04-23 DIAGNOSIS — L299 Pruritus, unspecified: Secondary | ICD-10-CM | POA: Diagnosis not present

## 2021-04-23 DIAGNOSIS — I959 Hypotension, unspecified: Secondary | ICD-10-CM | POA: Diagnosis present

## 2021-04-23 DIAGNOSIS — Z79899 Other long term (current) drug therapy: Secondary | ICD-10-CM | POA: Diagnosis not present

## 2021-04-23 DIAGNOSIS — G35 Multiple sclerosis: Principal | ICD-10-CM

## 2021-04-23 DIAGNOSIS — E559 Vitamin D deficiency, unspecified: Secondary | ICD-10-CM | POA: Diagnosis present

## 2021-04-23 DIAGNOSIS — E871 Hypo-osmolality and hyponatremia: Secondary | ICD-10-CM | POA: Diagnosis not present

## 2021-04-23 DIAGNOSIS — F419 Anxiety disorder, unspecified: Secondary | ICD-10-CM | POA: Diagnosis present

## 2021-04-23 DIAGNOSIS — K921 Melena: Secondary | ICD-10-CM | POA: Diagnosis not present

## 2021-04-23 DIAGNOSIS — R569 Unspecified convulsions: Secondary | ICD-10-CM | POA: Diagnosis present

## 2021-04-23 DIAGNOSIS — Z888 Allergy status to other drugs, medicaments and biological substances status: Secondary | ICD-10-CM | POA: Diagnosis not present

## 2021-04-23 DIAGNOSIS — T420X5A Adverse effect of hydantoin derivatives, initial encounter: Secondary | ICD-10-CM | POA: Diagnosis not present

## 2021-04-23 DIAGNOSIS — E876 Hypokalemia: Secondary | ICD-10-CM | POA: Diagnosis not present

## 2021-04-23 LAB — BASIC METABOLIC PANEL
Anion gap: 6 (ref 5–15)
BUN: 14 mg/dL (ref 6–20)
CO2: 25 mmol/L (ref 22–32)
Calcium: 8.6 mg/dL — ABNORMAL LOW (ref 8.9–10.3)
Chloride: 106 mmol/L (ref 98–111)
Creatinine, Ser: 0.52 mg/dL (ref 0.44–1.00)
GFR, Estimated: >60 mL/min (ref >60)
Glucose, Bld: 91 mg/dL (ref 70–99)
Potassium: 3.9 mmol/L (ref 3.5–5.1)
Sodium: 137 mmol/L (ref 135–145)

## 2021-04-23 LAB — MAGNESIUM: Magnesium: 2 mg/dL (ref 1.7–2.4)

## 2021-04-23 LAB — PHOSPHORUS: Phosphorus: 4.6 mg/dL (ref 2.5–4.6)

## 2021-04-23 LAB — CK: Total CK: 22 U/L — ABNORMAL LOW (ref 38–234)

## 2021-04-23 MED ORDER — GABAPENTIN 600 MG PO TABS
300.0000 mg | ORAL_TABLET | Freq: Three times a day (TID) | ORAL | Status: DC
  Administered 2021-04-23: 23:00:00 300 mg via ORAL
  Filled 2021-04-23 (×2): qty 1

## 2021-04-23 MED ORDER — GABAPENTIN 300 MG PO CAPS
300.0000 mg | ORAL_CAPSULE | Freq: Three times a day (TID) | ORAL | Status: DC
  Administered 2021-04-23 (×2): 300 mg via ORAL
  Filled 2021-04-23 (×2): qty 1

## 2021-04-23 MED ORDER — GABAPENTIN 600 MG PO TABS
300.0000 mg | ORAL_TABLET | Freq: Three times a day (TID) | ORAL | Status: DC
Start: 2021-04-24 — End: 2021-04-23

## 2021-04-23 MED ORDER — GABAPENTIN 600 MG PO TABS
300.0000 mg | ORAL_TABLET | Freq: Once | ORAL | Status: AC
  Administered 2021-04-23: 23:00:00 300 mg via ORAL
  Filled 2021-04-23: qty 1

## 2021-04-23 MED ORDER — LEVETIRACETAM IN NACL 1000 MG/100ML IV SOLN
1000.0000 mg | Freq: Once | INTRAVENOUS | Status: DC
  Filled 2021-04-23: qty 100

## 2021-04-23 MED ORDER — SODIUM CHLORIDE 0.9 % IV SOLN
1000.0000 mg | Freq: Once | INTRAVENOUS | Status: AC
  Administered 2021-04-23: 21:00:00 1000 mg via INTRAVENOUS
  Filled 2021-04-23: qty 20

## 2021-04-23 MED ORDER — SODIUM CHLORIDE 0.9 % IV SOLN
1000.0000 mg | Freq: Once | INTRAVENOUS | Status: DC
  Filled 2021-04-23: qty 20

## 2021-04-23 NOTE — Progress Notes (Addendum)
Direct LTM EEG hooked up and running - no initial skin breakdown - push button tested - neuro notified.

## 2021-04-23 NOTE — Procedures (Addendum)
Patient Name: Janice Rose  MRN: [id number]  Epilepsy Attending: Charlsie Quest  Referring Physician/Provider: Dr Erick Blinks Duration: 04/23/2021 4403 to 04/24/2021 0137  Patient history:  40 y.o. female with PMH significant for MS not on disease modifying treatment at this time and waiting for prior auth to start a medication who presents with several episodes of R sided spasms involving RUE, RLE and R face.  EEG to evaluate for seizure.  Level of alertness: Awake, asleep  AEDs during EEG study: GBP  Technical aspects: This EEG study was done with scalp electrodes positioned according to the 10-20 International system of electrode placement. Electrical activity was acquired at a sampling rate of 500Hz  and reviewed with a high frequency filter of 70Hz  and a low frequency filter of 1Hz . EEG data were recorded continuously and digitally stored.   Description: The posterior dominant rhythm consists of 9 Hz activity of moderate voltage (25-35 uV) seen predominantly in posterior head regions, symmetric and reactive to eye opening and eye closing. Sleep was characterized by vertex waves, sleep spindles (12 to 14 Hz), maximal frontocentral region.    Patient event button was pressed on 04/23/2021 at 0516, 0627 and 0716 for right arm and leg stiffness. Concomitant EEG before, during and after the event did not show any EEG change.  Hyperventilation and photic stimulation were not performed.      IMPRESSION: This study is within normal limits. No definite seizures or epileptiform discharges were seen throughout the recording.  Multiple episodes of right arm and leg stiffness were recorded without concomitant EEG change.  However focal motor seizures may not be seen on scalp EEG.  Therefore clinical correlation is recommended.  Rosell Khouri 04/25/2021

## 2021-04-23 NOTE — Progress Notes (Signed)
°  Transition of Care [facility]) Screening Note   Patient Details  Name: Janice Rose Date of Birth: 21-Nov-1980   Transition of Care [facility]) CM/SW Contact:    Harriet Masson, RN Phone Number: 04/23/2021, 1:07 PM    Transition of Care Department [facility]) has reviewed patient and no TOC needs have been identified at this time. We will continue to monitor patient advancement through interdisciplinary progression rounds. If new patient transition needs arise, please place a TOC consult.

## 2021-04-23 NOTE — Progress Notes (Signed)
Subjective: Has had multiple episodes described as right face twitching, right upper extremity stiffening with possible flexor posturing and hands making a fist as well as right lower extremity stiffening with full extension  ROS: negative except above  Examination  Vital signs in last 24 hours: Temp:  [98.3 F (36.8 C)] 98.3 F (36.8 C) (12/21 1246) Pulse Rate:  [75-114] 95 (12/22 1100) Resp:  [13-18] 18 (12/22 1100) BP: (93-125)/(56-85) 113/79 (12/22 1100) SpO2:  [96 %-100 %] 100 % (12/22 1100)  General: lying in bed, NAD CVS: pulse-normal rate and rhythm RS: breathing comfortably, CTAB Extremities: warm, no edema  Neuro: MS: Alert, oriented, follows commands CN: pupils equal and reactive,  EOMI, face symmetric, tongue midline, normal sensation over face, Motor: 5/5 strength in all 4 extremities  Basic Metabolic Panel: Recent Labs  Lab 04/20/21 1009 04/22/21 1310 04/23/21 0349  NA 137 137 137  K 4.2 3.9 3.9  CL 105 103 106  CO2 24 23 25   GLUCOSE 111* 84 91  BUN 7 8 14   CREATININE 0.56 0.73 0.52  CALCIUM 9.3 9.5 8.6*  MG  --   --  2.0  PHOS  --   --  4.6    CBC: Recent Labs  Lab 04/20/21 1009 04/22/21 1310  WBC 9.7 11.3*  NEUTROABS 6.3  --   HGB 12.7 12.8  HCT 39.0 39.3  MCV 94.9 94.9  PLT 402* 386     Coagulation Studies: No results for input(s): LABPROT, INR in the last 72 hours.  Imaging MRI brain with and without contrast 04/22/2021: Decreased size of the tumefactive demyelinating lesion in the left centrum semiovale with nearly resolved enhancement since 03/14/2021. 2. Scattered additional demyelinating lesions in both cerebral hemispheres are unchanged. 3. No new abnormal enhancement.  MR cervical spine with and without contrast 04/22/2021: No convincing evidence of cord signal abnormality. No abnormal enhancement to suggest active demyelination.  ASSESSMENT AND PLAN: 40 year old female with multiple sclerosis presented with episodes of  right face twitching, upper extremity and lower extremity stiffening.  Transient right-sided stiffening Relapsing remitting multiple sclerosis -EEG did not show any ictal-interictal activity.  However focal motor seizures may not be seen on scalp EEG. -Differential for parent episodes includes muscle spasms versus focal seizures  Recommendations -Continue gabapentin 300 mg 3 times daily -Discussed with patient that because these episodes are not showing up on scalp EEG, it is possible that these are focal motor seizures versus muscle spasms.  The semiology of the episodes (facial twitching, right upper extremity flexor posturing with fist) makes me concerned for focal seizures.  I have discussed with patient and husband at bedside as well as patient's sister on phone about potentially loading with IV fosphenytoin.  If that does not work I would consider loading with IV Keppra.  If antiseizure medications do not work, we we will try treating with antispasmodics (baclofen versus Flexeril) -Patient would like some time to think about her options and let me know -In the meanwhile, continue LTM EEG to look for intermittent seizures -Management of rest of comorbidities per primary team  I have spent a total of 40  minutes with the patient reviewing hospital notes,  test results, labs and examining the patient as well as establishing an assessment and plan that was discussed personally with the patient.  > 50% of time was spent in direct patient care.    04/24/2021 Epilepsy [facility] For questions after 5pm please refer to AMION to reach the Neurologist on  call

## 2021-04-23 NOTE — Progress Notes (Addendum)
[facility]  PROGRESS NOTE  Janice Rose ZJQ:[id number] DOB: [birth year] DOA: 04/22/2021 PCP: Lucianne Lei, MD   Brief HPI:   40 year old female with medical history of multiple sclerosis, presented with right side muscle spasms.  Patient recently was admitted to hospital in November for right-sided paresthesia felt due to multiple sclerosis flare.  She was treated with high-dose Solu-Medrol for 5 days and was discharged with outpatient neurology follow-up.  On 04/18/2021 she developed new right-sided muscle spasm involving right face, right arm and right leg.  The spasm would call right arm to contract medially in a flexed position because her right leg to contract in full extension.  She came to ED on 12/19 with persistent symptoms Case was discussed with neurology and patient started on gabapentin 300 mg 3 times daily.  Patient says that she took gabapentin however the episodes became worse after the medication.  She came to ED.  Neurology was consulted.  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  Neurology recommended continuous EEG monitoring.    Subjective   This morning patient says that she already had 3 episodes while she was hooked up to the continuous EEG.  She complains of pain along with these episodes especially involving the toes of right foot.  Did not lose consciousness during these episodes.   Assessment/Plan:     Right-sided muscle spasms in the setting of multiple sclerosis, rule out seizure -Patient having these episodes of right side muscle spasm lasting for few minutes -Continuous EEG monitoring started last night -As per epileptologist, no EEG changes noted with these episodes -We will wait for neurology to follow-up and make recommendations -Continue gabapentin 300 mg p.o. 3 times daily  Multiple sclerosis -Patient had a relapse of MS in 2022 -MRI brain  shows improvement since last month -MRI cervical spine showed no acute abnormality -Outpatient neurology follow-up  Right foot pain -Patient complains of pain in the toes of right foot along with  episodes -She has Tylenol as needed ordered, Dilaudid 0.5 mg every 3 hours as needed  Medications     enoxaparin (LOVENOX) injection  40 mg Subcutaneous Q24H   gabapentin  300 mg Oral TID     Data Reviewed:   CBG:  No results for input(s): GLUCAP in the last 168 hours.  SpO2: 100 %    Vitals:   04/23/21 0630 04/23/21 0700 04/23/21 0715 04/23/21 0800  BP: 103/63 97/66 96/66  95/65  Pulse: 87 77 77 91  Resp:  16  17  Temp:      TempSrc:      SpO2: 100% 100% 100% 100%    No intake or output data in the 24 hours ending 04/23/21 0930  No intake/output data recorded.  There were no vitals filed for this visit.  Data Reviewed: Basic Metabolic Panel: Recent Labs  Lab 04/20/21 1009 04/22/21 1310 04/23/21 0349  NA 137 137 137  K 4.2 3.9 3.9  CL 105 103 106  CO2 24 23 25   GLUCOSE 111* 84 91  BUN 7 8 14   CREATININE 0.56 0.73 0.52  CALCIUM 9.3 9.5 8.6*  MG  --   --  2.0  PHOS  --   --  4.6   Liver Function Tests: Recent Labs  Lab 04/22/21 1310  AST 22  ALT 17  ALKPHOS 62  BILITOT 0.6  PROT 7.6  ALBUMIN 4.0   No results for input(s): LIPASE,  AMYLASE in the last 168 hours. No results for input(s): AMMONIA in the last 168 hours. CBC: Recent Labs  Lab 04/20/21 1009 04/22/21 1310  WBC 9.7 11.3*  NEUTROABS 6.3  --   HGB 12.7 12.8  HCT 39.0 39.3  MCV 94.9 94.9  PLT 402* 386   Cardiac Enzymes: Recent Labs  Lab 04/23/21 0349  CKTOTAL 22*   BNP (last 3 results) No results for input(s): BNP in the last 8760 hours.  ProBNP (last 3 results) No results for input(s): PROBNP in the last 8760 hours.  CBG: No results for input(s): GLUCAP in the last 168 hours.     Radiology Reports  MR Brain W and Wo Contrast  Result Date: 04/22/2021 CLINICAL DATA:   Multiple sclerosis, severe muscle spasms of the right upper and lower extremities, right face. EXAM: MRI HEAD WITHOUT AND WITH CONTRAST TECHNIQUE: Multiplanar, multiecho pulse sequences of the brain and surrounding structures were obtained without and with intravenous contrast. CONTRAST:  13mL GADAVIST GADOBUTROL 1 MMOL/ML IV SOLN COMPARISON:  Brain MRI 03/14/2021 FINDINGS: Brain: Again seen is confluent FLAIR hyperintensity in the left corona radiata today measuring up to approximately 2.8 cm TV by 2.2 cm AP, decreased in size from 3.8 cm TV by 3.0 cm AP measured at a similar level. Previously seen incomplete ring enhancement of this lesion has markedly improved though not entirely resolved (23-19). Numerous additional foci of FLAIR signal abnormality in the juxtacortical and periventricular white matter in both cerebral hemispheres are not significantly changed. There is no new abnormal enhancement. Parenchymal volume is normal. The ventricles are normal in size. There is no evidence of acute intracranial hemorrhage, extra-axial fluid collection, or acute infarct. There is no new mass lesion.  There is no midline shift. Vascular: Normal flow voids. Skull and upper cervical spine: Normal marrow signal. Sinuses/Orbits: The paranasal sinuses are clear. The globes and orbits are unremarkable. The optic nerves are grossly unremarkable on these nondedicated sequences. Other: None. IMPRESSION: 1. Decreased size of the tumefactive demyelinating lesion in the left centrum semiovale with nearly resolved enhancement since 03/14/2021. 2. Scattered additional demyelinating lesions in both cerebral hemispheres are unchanged. 3. No new abnormal enhancement. Electronically Signed   By: Lesia Hausen M.D.   On: 04/22/2021 18:20   MR Cervical Spine W or Wo Contrast  Result Date: 04/22/2021 CLINICAL DATA:  Multiple sclerosis, new weakness, muscle spasms of right upper and lower extremities and right base EXAM: MRI CERVICAL SPINE  WITHOUT AND WITH CONTRAST TECHNIQUE: Multiplanar and multiecho pulse sequences of the cervical spine, to include the craniocervical junction and cervicothoracic junction, were obtained without and with intravenous contrast. CONTRAST:  36mL GADAVIST GADOBUTROL 1 MMOL/ML IV SOLN COMPARISON:  Cervical spine MRI 03/18/2021 FINDINGS: Alignment: There is slight reversal of the normal cervical spine lordosis. There is no antero or retrolisthesis. Vertebrae: Vertebral body heights are preserved. There is no marrow signal abnormality. There is no abnormal marrow enhancement. Cord: There is no convincing evidence of cord signal abnormality. There is no abnormal cord enhancement. Posterior Fossa, vertebral arteries, paraspinal tissues: The posterior fossa is assessed on the separately dictated brain MRI. The vertebral artery flow voids are present. The paraspinal soft tissues are unremarkable. Disc levels: There is mild degenerative change in the cervical spine, most advanced at C5-C6, unchanged since the recent cervical spine MRI. IMPRESSION: No convincing evidence of cord signal abnormality. No abnormal enhancement to suggest active demyelination. Electronically Signed   By: Lesia Hausen M.D.   On: 04/22/2021  18:24   Overnight EEG with video  Result Date: 04/23/2021 Charlsie Quest, MD     04/23/2021  9:14 AM Patient Name: Janice Rose MRN: [id number] Epilepsy Attending: Charlsie Quest Referring Physician/Provider: Dr Erick Blinks Duration: 04/23/2021 0137 to 04/23/2021 0900 Patient history:  40 y.o. female with PMH significant for MS not on disease modifying treatment at this time and waiting for prior auth to start a medication who presents with several episodes of R sided spasms involving RUE, RLE and R face.  EEG to evaluate for seizure. Level of alertness: Awake, asleep AEDs during EEG study: GBP Technical aspects: This EEG study was done with scalp electrodes positioned according to the 10-20  International system of electrode placement. Electrical activity was acquired at a sampling rate of 500Hz  and reviewed with a high frequency filter of 70Hz  and a low frequency filter of 1Hz . EEG data were recorded continuously and digitally stored. Description: The posterior dominant rhythm consists of 9 Hz activity of moderate voltage (25-35 uV) seen predominantly in posterior head regions, symmetric and reactive to eye opening and eye closing. Sleep was characterized by vertex waves, sleep spindles (12 to 14 Hz), maximal frontocentral region.  Patient event button was pressed on 04/23/2021 at 0516, 0627 and 0716 for right arm and leg stiffness. Concomitant EEG before, during and after the event did not show any EEG change. Hyperventilation and photic stimulation were not performed.   IMPRESSION: This study is within normal limits. No definite seizures or epileptiform discharges were seen throughout the recording. Multiple episodes of right arm and leg stiffness were recorded without concomitant EEG change.  However focal motor seizures may not be seen on scalp EEG.  Therefore clinical correlation is recommended. Priyanka 04/25/2021       Antibiotics: Anti-infectives (From admission, onward)    None         DVT prophylaxis: Lovenox  Code Status: Full code  Family Communication: No family at bedside   Consultants: Neurology  Procedures: EEG    Objective    Physical Examination:   General-appears in no acute distress Heart-S1-S2, regular, no murmur auscultated Lungs-clear to auscultation bilaterally, no wheezing or crackles auscultated Abdomen-soft, nontender, no organomegaly Extremities-no edema in the lower extremities Neuro-alert, oriented x3, no focal deficit noted  Status is: Inpatient  Dispo: The patient is from: Home              Anticipated d/c is to: Home              Anticipated d/c date is: 04/24/2021              Patient currently not stable for  discharge  Barrier to discharge-ongoing continuous EEG monitoring  COVID-19 Labs  No results for input(s): DDIMER, FERRITIN, LDH, CRP in the last 72 hours.  Lab Results  Component Value Date   SARSCOV2NAA NEGATIVE 04/22/2021   SARSCOV2NAA NEGATIVE 03/18/2021            Recent Results (from the past 240 hour(s))  Resp Panel by RT-PCR (Flu A&B, Covid) Nasopharyngeal Swab     Status: None   Collection Time: 04/22/21  8:02 PM   Specimen: Nasopharyngeal Swab; Nasopharyngeal(NP) swabs in vial transport medium  Result Value Ref Range Status   SARS Coronavirus 2 by RT PCR NEGATIVE NEGATIVE Final    Comment: (NOTE) SARS-CoV-2 target nucleic acids are NOT DETECTED.  The SARS-CoV-2 RNA is generally detectable in upper respiratory specimens during the acute phase of infection. The  lowest concentration of SARS-CoV-2 viral copies this assay can detect is 138 copies/mL. A negative result does not preclude SARS-Cov-2 infection and should not be used as the sole basis for treatment or other patient management decisions. A negative result may occur with  improper specimen collection/handling, submission of specimen other than nasopharyngeal swab, presence of viral mutation(s) within the areas targeted by this assay, and inadequate number of viral copies(<138 copies/mL). A negative result must be combined with clinical observations, patient history, and epidemiological information. The expected result is Negative.  Fact Sheet for Patients:  BloggerCourse.com  Fact Sheet for Healthcare Providers:  SeriousBroker.it  This test is no t yet approved or cleared by the [place] FDA and  has been authorized for detection and/or diagnosis of SARS-CoV-2 by FDA under an Emergency Use Authorization (EUA). This EUA will remain  in effect (meaning this test can be used) for the duration of the COVID-19 declaration under Section 564(b)(1) of  the Act, 21 U.S.C.section 360bbb-3(b)(1), unless the authorization is terminated  or revoked sooner.       Influenza A by PCR NEGATIVE NEGATIVE Final   Influenza B by PCR NEGATIVE NEGATIVE Final    Comment: (NOTE) The Xpert Xpress SARS-CoV-2/FLU/RSV plus assay is intended as an aid in the diagnosis of influenza from Nasopharyngeal swab specimens and should not be used as a sole basis for treatment. Nasal washings and aspirates are unacceptable for Xpert Xpress SARS-CoV-2/FLU/RSV testing.  Fact Sheet for Patients: BloggerCourse.com  Fact Sheet for Healthcare Providers: SeriousBroker.it  This test is not yet approved or cleared by the [place] FDA and has been authorized for detection and/or diagnosis of SARS-CoV-2 by FDA under an Emergency Use Authorization (EUA). This EUA will remain in effect (meaning this test can be used) for the duration of the COVID-19 declaration under Section 564(b)(1) of the Act, 21 U.S.C. section 360bbb-3(b)(1), unless the authorization is terminated or revoked.  Performed at [facility], [address], [place], [place] [address]     Meredeth Ide   [facility] If 7PM-7AM, please contact night-coverage at [email or web address], Office  [phone]   04/23/2021, 9:30 AM  LOS: 0 days

## 2021-04-23 NOTE — ED Notes (Signed)
Doctor at bedside.

## 2021-04-23 NOTE — Progress Notes (Addendum)
Brief Neuro Update:  Patient reports feeling woozy and paresthesias in the pelvic region as soon as Fosphenytoin was started. The infusion was slowed down but the symptoms persisted and thus I asked to stop it.  I spoke to patient at bedside. She tells me today that she feels like Gabapentin has helped her. Specifically, took Gabapentin around 1030 AM in the morning and did not have episodes until 730pm. This is different than what she told me yesterday that she felt like gabapentin was not helpful and her symptoms worsened despite Gabapentin.  I spoke to her about doing a 1 time dose of Keppra vs just giving her an additional 300mg  of Gabapentin at bedtime to see if this results in decrease in frequency or intensity of her R sided spasms. The joint decision that we made as to keep it simple for right now and just give her an extra dose of Gabapentin tonight to see if this helps her symptoms. She will keep ay eye on the intensity and the frequency of her symptoms tonight.  We also discussed but she declined benadryl at this time 2/2 side effects in the past and concerns that it caused a paradoxical reaction. The itching in her pelvis has resolved so will hold off on it. A small dose of IV steroids is another option but will hold off on it given itching is spontaneously improving.  [facility] Pager Number Erick Blinks

## 2021-04-24 DIAGNOSIS — G35 Multiple sclerosis: Secondary | ICD-10-CM | POA: Diagnosis not present

## 2021-04-24 DIAGNOSIS — R252 Cramp and spasm: Secondary | ICD-10-CM | POA: Diagnosis not present

## 2021-04-24 DIAGNOSIS — M62838 Other muscle spasm: Secondary | ICD-10-CM | POA: Diagnosis not present

## 2021-04-24 MED ORDER — LACTATED RINGERS IV BOLUS
1000.0000 mL | Freq: Once | INTRAVENOUS | Status: AC
  Administered 2021-04-24: 22:00:00 1000 mL via INTRAVENOUS

## 2021-04-24 MED ORDER — OXCARBAZEPINE 150 MG PO TABS
150.0000 mg | ORAL_TABLET | Freq: Two times a day (BID) | ORAL | Status: DC
  Administered 2021-04-24 – 2021-04-26 (×5): 150 mg via ORAL
  Filled 2021-04-24 (×6): qty 1

## 2021-04-24 NOTE — Progress Notes (Signed)
LTM EEG discontinued - no skin breakdown at unhook.   

## 2021-04-24 NOTE — Procedures (Addendum)
Patient Name: Janice Rose  MRN: [id number]  Epilepsy Attending: Charlsie Quest  Referring Physician/Provider: Dr Erick Blinks Duration: 04/24/2021 0347 to 04/24/2021 0902   Patient history:  40 y.o. female with PMH significant for MS not on disease modifying treatment at this time and waiting for prior auth to start a medication who presents with several episodes of R sided spasms involving RUE, RLE and R face.  EEG to evaluate for seizure.   Level of alertness: Awake, asleep   AEDs during EEG study: GBP   Technical aspects: This EEG study was done with scalp electrodes positioned according to the 10-20 International system of electrode placement. Electrical activity was acquired at a sampling rate of 500Hz  and reviewed with a high frequency filter of 70Hz  and a low frequency filter of 1Hz . EEG data were recorded continuously and digitally stored.    Description: The posterior dominant rhythm consists of 9 Hz activity of moderate voltage (25-35 uV) seen predominantly in posterior head regions, symmetric and reactive to eye opening and eye closing. Sleep was characterized by vertex waves, sleep spindles (12 to 14 Hz), maximal frontocentral region. Hyperventilation and photic stimulation were not performed.     IMPRESSION: This study is within normal limits. No definite seizures or epileptiform discharges were seen throughout the recording.  Magdala Brahmbhatt 

## 2021-04-24 NOTE — Progress Notes (Signed)
[facility]  PROGRESS NOTE  Janice Rose ZOX:[id number] DOB: 1980-06-27 DOA: 04/22/2021 PCP: Lucianne Lei, MD   Brief HPI:   40 year old female with medical history of multiple sclerosis, presented with right side muscle spasms.  Patient recently was admitted to hospital in November for right-sided paresthesia felt due to multiple sclerosis flare.  She was treated with high-dose Solu-Medrol for 5 days and was discharged with outpatient neurology follow-up.  On 04/18/2021 she developed new right-sided muscle spasm involving right face, right arm and right leg.  The spasm would call right arm to contract medially in a flexed position because her right leg to contract in full extension.  She came to ED on 12/19 with persistent symptoms Case was discussed with neurology and patient started on gabapentin 300 mg 3 times daily.  Patient says that she took gabapentin however the episodes became worse after the medication.  She came to ED.  Neurology was consulted.  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  Neurology recommended continuous EEG monitoring.    Subjective   Patient did not have EEG evidence of seizure during these episodes however neurology feels that she could have focal motor seizures which cannot be picked up on the LTM EEG.  Patient started on oxcarbazepine 150 mg twice daily.  Gabapentin is currently on hold.   Assessment/Plan:     Right-sided muscle spasms in the setting of multiple sclerosis, rule out seizure -Patient having these episodes of right side muscle spasm lasting for few minutes -Continuous EEG monitoring was started in the hospital -As per epileptologist, no EEG changes noted with these episodes -However patient can have focal seizures -Patient was already on gabapentin -Gabapentin has been discontinued and patient started on oxcarbazepine -Further  recommendations as per neurology.   Multiple sclerosis -Patient had a relapse of MS in 2022 -MRI brain shows improvement since last month -MRI cervical spine showed no acute abnormality -Outpatient neurology follow-up  Right foot pain -Patient complains of pain in the toes of right foot along with  episodes -She has Tylenol as needed ordered, Dilaudid 0.5 mg every 3 hours as needed  Medications     enoxaparin (LOVENOX) injection  40 mg Subcutaneous Q24H   OXcarbazepine  150 mg Oral BID     Data Reviewed:   CBG:  No results for input(s): GLUCAP in the last 168 hours.  SpO2: 98 %    Vitals:   04/24/21 0100 04/24/21 0515 04/24/21 0743 04/24/21 1142  BP: 90/61 (!) 85/70 94/66 (!) 87/67  Pulse:  77 83 79  Resp:  18 17 18   Temp:  98 F (36.7 C) 98.5 F (36.9 C) 98.5 F (36.9 C)  TempSrc:  Axillary Oral Oral  SpO2: 98% 96% 99% 98%  Weight:      Height:        No intake or output data in the 24 hours ending 04/24/21 1543  No intake/output data recorded.  Filed Weights   04/23/21 1155  Weight: 58.1 kg    Data Reviewed: Basic Metabolic Panel: Recent Labs  Lab 04/20/21 1009 04/22/21 1310 04/23/21 0349  NA 137 137 137  K 4.2 3.9 3.9  CL 105 103 106  CO2 24 23 25   GLUCOSE 111* 84 91  BUN 7 8 14   CREATININE 0.56 0.73 0.52  CALCIUM 9.3 9.5 8.6*  MG  --   --  2.0  PHOS  --   --  4.6   Liver Function Tests: Recent Labs  Lab 04/22/21 1310  AST 22  ALT 17  ALKPHOS 62  BILITOT 0.6  PROT 7.6  ALBUMIN 4.0   No results for input(s): LIPASE, AMYLASE in the last 168 hours. No results for input(s): AMMONIA in the last 168 hours. CBC: Recent Labs  Lab 04/20/21 1009 04/22/21 1310  WBC 9.7 11.3*  NEUTROABS 6.3  --   HGB 12.7 12.8  HCT 39.0 39.3  MCV 94.9 94.9  PLT 402* 386   Cardiac Enzymes: Recent Labs  Lab 04/23/21 0349  CKTOTAL 22*   BNP (last 3 results) No results for input(s): BNP in the last 8760 hours.  ProBNP (last 3 results) No  results for input(s): PROBNP in the last 8760 hours.  CBG: No results for input(s): GLUCAP in the last 168 hours.     Radiology Reports  MR Brain W and Wo Contrast  Result Date: 04/22/2021 CLINICAL DATA:  Multiple sclerosis, severe muscle spasms of the right upper and lower extremities, right face. EXAM: MRI HEAD WITHOUT AND WITH CONTRAST TECHNIQUE: Multiplanar, multiecho pulse sequences of the brain and surrounding structures were obtained without and with intravenous contrast. CONTRAST:  76mL GADAVIST GADOBUTROL 1 MMOL/ML IV SOLN COMPARISON:  Brain MRI 03/14/2021 FINDINGS: Brain: Again seen is confluent FLAIR hyperintensity in the left corona radiata today measuring up to approximately 2.8 cm TV by 2.2 cm AP, decreased in size from 3.8 cm TV by 3.0 cm AP measured at a similar level. Previously seen incomplete ring enhancement of this lesion has markedly improved though not entirely resolved (23-19). Numerous additional foci of FLAIR signal abnormality in the juxtacortical and periventricular white matter in both cerebral hemispheres are not significantly changed. There is no new abnormal enhancement. Parenchymal volume is normal. The ventricles are normal in size. There is no evidence of acute intracranial hemorrhage, extra-axial fluid collection, or acute infarct. There is no new mass lesion.  There is no midline shift. Vascular: Normal flow voids. Skull and upper cervical spine: Normal marrow signal. Sinuses/Orbits: The paranasal sinuses are clear. The globes and orbits are unremarkable. The optic nerves are grossly unremarkable on these nondedicated sequences. Other: None. IMPRESSION: 1. Decreased size of the tumefactive demyelinating lesion in the left centrum semiovale with nearly resolved enhancement since 03/14/2021. 2. Scattered additional demyelinating lesions in both cerebral hemispheres are unchanged. 3. No new abnormal enhancement. Electronically Signed   By: Lesia Hausen M.D.   On:  04/22/2021 18:20   MR Cervical Spine W or Wo Contrast  Result Date: 04/22/2021 CLINICAL DATA:  Multiple sclerosis, new weakness, muscle spasms of right upper and lower extremities and right base EXAM: MRI CERVICAL SPINE WITHOUT AND WITH CONTRAST TECHNIQUE: Multiplanar and multiecho pulse sequences of the cervical spine, to include the craniocervical junction and cervicothoracic junction, were obtained without and with intravenous contrast. CONTRAST:  37mL GADAVIST GADOBUTROL 1 MMOL/ML IV SOLN COMPARISON:  Cervical spine MRI 03/18/2021 FINDINGS: Alignment: There is slight reversal of the normal cervical spine lordosis. There is no antero or retrolisthesis. Vertebrae: Vertebral body heights are preserved. There is no marrow signal abnormality. There is no abnormal marrow enhancement. Cord: There is no convincing evidence of cord signal abnormality. There is no abnormal cord enhancement. Posterior Fossa, vertebral arteries, paraspinal tissues: The posterior fossa is assessed on the separately dictated brain MRI. The vertebral artery flow voids are present. The paraspinal soft tissues are unremarkable. Disc levels: There is mild degenerative change in the cervical spine, most advanced  at C5-C6, unchanged since the recent cervical spine MRI. IMPRESSION: No convincing evidence of cord signal abnormality. No abnormal enhancement to suggest active demyelination. Electronically Signed   By: Lesia Hausen M.D.   On: 04/22/2021 18:24   Overnight EEG with video  Result Date: 04/23/2021 Charlsie Quest, MD     04/24/2021  8:36 AM Patient Name: Janice Rose MRN: [id number] Epilepsy Attending: Charlsie Quest Referring Physician/Provider: Dr Erick Blinks Duration: 04/23/2021 6063 to 04/24/2021 0137 Patient history:  40 y.o. female with PMH significant for MS not on disease modifying treatment at this time and waiting for prior auth to start a medication who presents with several episodes of R sided spasms  involving RUE, RLE and R face.  EEG to evaluate for seizure. Level of alertness: Awake, asleep AEDs during EEG study: GBP Technical aspects: This EEG study was done with scalp electrodes positioned according to the 10-20 International system of electrode placement. Electrical activity was acquired at a sampling rate of 500Hz  and reviewed with a high frequency filter of 70Hz  and a low frequency filter of 1Hz . EEG data were recorded continuously and digitally stored. Description: The posterior dominant rhythm consists of 9 Hz activity of moderate voltage (25-35 uV) seen predominantly in posterior head regions, symmetric and reactive to eye opening and eye closing. Sleep was characterized by vertex waves, sleep spindles (12 to 14 Hz), maximal frontocentral region.  Patient event button was pressed on 04/23/2021 at 0516, 0627 and 0716 for right arm and leg stiffness. Concomitant EEG before, during and after the event did not show any EEG change. Hyperventilation and photic stimulation were not performed.   IMPRESSION: This study is within normal limits. No definite seizures or epileptiform discharges were seen throughout the recording. Multiple episodes of right arm and leg stiffness were recorded without concomitant EEG change.  However focal motor seizures may not be seen on scalp EEG.  Therefore clinical correlation is recommended. Priyanka 04/25/2021       Antibiotics: Anti-infectives (From admission, onward)    None         DVT prophylaxis: Lovenox  Code Status: Full code  Family Communication: No family at bedside   Consultants: Neurology  Procedures: EEG    Objective    Physical Examination:   General-appears in no acute distress Heart-S1-S2, regular, no murmur auscultated Lungs-clear to auscultation bilaterally, no wheezing or crackles auscultated Abdomen-soft, nontender, no organomegaly Extremities-no edema in the lower extremities Neuro-alert, oriented x3, no focal deficit  noted  Status is: Inpatient  Dispo: The patient is from: Home              Anticipated d/c is to: Home              Anticipated d/c date is: 04/25/2021              Patient currently not stable for discharge  Barrier to discharge-ongoing continuous EEG monitoring  COVID-19 Labs  No results for input(s): DDIMER, FERRITIN, LDH, CRP in the last 72 hours.  Lab Results  Component Value Date   SARSCOV2NAA NEGATIVE 04/22/2021   SARSCOV2NAA NEGATIVE 03/18/2021            Recent Results (from the past 240 hour(s))  Resp Panel by RT-PCR (Flu A&B, Covid) Nasopharyngeal Swab     Status: None   Collection Time: 04/22/21  8:02 PM   Specimen: Nasopharyngeal Swab; Nasopharyngeal(NP) swabs in vial transport medium  Result Value Ref Range Status   SARS Coronavirus  2 by RT PCR NEGATIVE NEGATIVE Final    Comment: (NOTE) SARS-CoV-2 target nucleic acids are NOT DETECTED.  The SARS-CoV-2 RNA is generally detectable in upper respiratory specimens during the acute phase of infection. The lowest concentration of SARS-CoV-2 viral copies this assay can detect is 138 copies/mL. A negative result does not preclude SARS-Cov-2 infection and should not be used as the sole basis for treatment or other patient management decisions. A negative result may occur with  improper specimen collection/handling, submission of specimen other than nasopharyngeal swab, presence of viral mutation(s) within the areas targeted by this assay, and inadequate number of viral copies(<138 copies/mL). A negative result must be combined with clinical observations, patient history, and epidemiological information. The expected result is Negative.  Fact Sheet for Patients:  BloggerCourse.com  Fact Sheet for Healthcare Providers:  SeriousBroker.it  This test is no t yet approved or cleared by the [place] FDA and  has been authorized for detection and/or diagnosis  of SARS-CoV-2 by FDA under an Emergency Use Authorization (EUA). This EUA will remain  in effect (meaning this test can be used) for the duration of the COVID-19 declaration under Section 564(b)(1) of the Act, 21 U.S.C.section 360bbb-3(b)(1), unless the authorization is terminated  or revoked sooner.       Influenza A by PCR NEGATIVE NEGATIVE Final   Influenza B by PCR NEGATIVE NEGATIVE Final    Comment: (NOTE) The Xpert Xpress SARS-CoV-2/FLU/RSV plus assay is intended as an aid in the diagnosis of influenza from Nasopharyngeal swab specimens and should not be used as a sole basis for treatment. Nasal washings and aspirates are unacceptable for Xpert Xpress SARS-CoV-2/FLU/RSV testing.  Fact Sheet for Patients: BloggerCourse.com  Fact Sheet for Healthcare Providers: SeriousBroker.it  This test is not yet approved or cleared by the [place] FDA and has been authorized for detection and/or diagnosis of SARS-CoV-2 by FDA under an Emergency Use Authorization (EUA). This EUA will remain in effect (meaning this test can be used) for the duration of the COVID-19 declaration under Section 564(b)(1) of the Act, 21 U.S.C. section 360bbb-3(b)(1), unless the authorization is terminated or revoked.  Performed at [facility], [address] [place], [place] [address]     Meredeth Ide   [facility] If 7PM-7AM, please contact night-coverage at [email or web address], Office  [phone]   04/24/2021, 3:43 PM  LOS: 1 day

## 2021-04-24 NOTE — Progress Notes (Signed)
Subjective: Had 1 episode of leg spasm yesterday.  Was given IV loading dose of fosphenytoin after that, received about half  dose but had to be stopped due to severe itching and pelvic paresthesias.  States that she is still feeling dizzy when she closes her eyes this morning and has ear fullness.  Also reported intermittent episodes of right side paresthesias.   ROS: negative except above  Examination  Vital signs in last 24 hours: Temp:  [98 F (36.7 C)-98.5 F (36.9 C)] 98.5 F (36.9 C) (12/23 0743) Pulse Rate:  [72-96] 83 (12/23 0743) Resp:  [16-18] 17 (12/23 0743) BP: (85-105)/(57-75) 94/66 (12/23 0743) SpO2:  [96 %-100 %] 99 % (12/23 0743) Weight:  [58.1 kg] 58.1 kg (12/22 1155)  General: lying in bed, NAD CVS: pulse-normal rate and rhythm RS: breathing comfortably, CTAB Extremities: warm, no edema   Neuro: MS: Alert, oriented, follows commands CN: pupils equal and reactive,  EOMI, face symmetric, tongue midline, normal sensation over face, Motor: 5/5 strength in all 4 extremities  Basic Metabolic Panel: Recent Labs  Lab 04/20/21 1009 04/22/21 1310 04/23/21 0349  NA 137 137 137  K 4.2 3.9 3.9  CL 105 103 106  CO2 24 23 25   GLUCOSE 111* 84 91  BUN 7 8 14   CREATININE 0.56 0.73 0.52  CALCIUM 9.3 9.5 8.6*  MG  --   --  2.0  PHOS  --   --  4.6    CBC: Recent Labs  Lab 04/20/21 1009 04/22/21 1310  WBC 9.7 11.3*  NEUTROABS 6.3  --   HGB 12.7 12.8  HCT 39.0 39.3  MCV 94.9 94.9  PLT 402* 386     Coagulation Studies: No results for input(s): LABPROT, INR in the last 72 hours.  Imaging No new brain imaging overnight  ASSESSMENT AND PLAN:  40 year old female with multiple sclerosis presented with episodes of right face twitching, upper extremity and lower extremity stiffening.   Transient right-sided stiffening Relapsing remitting multiple sclerosis -EEG did not show any ictal-interictal activity.  However focal motor seizures may not be seen on scalp  EEG. -Differential for parent episodes includes muscle spasms versus focal seizures   Recommendations -We will start oxcarbazepine 150 mg twice daily -DC LTM EEG as no ictal-interictal activity on EEG -We will hold off on gabapentin while we use oxcarbazepine to avoid confounding factors -Management of rest of comorbidities per primary team   I have spent a total of 36 minutes with the patient reviewing hospital notes,  test results, labs and examining the patient as well as establishing an assessment and plan that was discussed personally with the patient.  > 50% of time was spent in direct patient care.    04/24/21 Epilepsy [facility] For questions after 5pm please refer to AMION to reach the Neurologist on call

## 2021-04-25 DIAGNOSIS — M62838 Other muscle spasm: Secondary | ICD-10-CM | POA: Diagnosis not present

## 2021-04-25 DIAGNOSIS — G35 Multiple sclerosis: Secondary | ICD-10-CM | POA: Diagnosis not present

## 2021-04-25 DIAGNOSIS — R252 Cramp and spasm: Secondary | ICD-10-CM | POA: Diagnosis not present

## 2021-04-25 LAB — BASIC METABOLIC PANEL
Anion gap: 6 (ref 5–15)
BUN: <5 mg/dL — ABNORMAL LOW (ref 6–20)
CO2: 25 mmol/L (ref 22–32)
Calcium: 8.7 mg/dL — ABNORMAL LOW (ref 8.9–10.3)
Chloride: 108 mmol/L (ref 98–111)
Creatinine, Ser: 0.51 mg/dL (ref 0.44–1.00)
GFR, Estimated: >60 mL/min (ref >60)
Glucose, Bld: 92 mg/dL (ref 70–99)
Potassium: 4 mmol/L (ref 3.5–5.1)
Sodium: 139 mmol/L (ref 135–145)

## 2021-04-25 LAB — CBC
HCT: 35.7 % — ABNORMAL LOW (ref 36.0–46.0)
Hemoglobin: 11.7 g/dL — ABNORMAL LOW (ref 12.0–15.0)
MCH: 30.7 pg (ref 26.0–34.0)
MCHC: 32.8 g/dL (ref 30.0–36.0)
MCV: 93.7 fL (ref 80.0–100.0)
Platelets: 277 10*3/uL (ref 150–400)
RBC: 3.81 MIL/uL — ABNORMAL LOW (ref 3.87–5.11)
RDW: 13.9 % (ref 11.5–15.5)
WBC: 6.9 10*3/uL (ref 4.0–10.5)
nRBC: 0 % (ref 0.0–0.2)

## 2021-04-25 MED ORDER — VITAMIN D (ERGOCALCIFEROL) 1.25 MG (50000 UNIT) PO CAPS
50000.0000 [IU] | ORAL_CAPSULE | ORAL | Status: DC
  Administered 2021-04-25: 16:00:00 50000 [IU] via ORAL
  Filled 2021-04-25: qty 1

## 2021-04-25 MED ORDER — LORAZEPAM 2 MG/ML IJ SOLN: 1.0000 mg | Freq: Once | INTRAMUSCULAR | Status: DC | PRN

## 2021-04-25 MED ORDER — CYANOCOBALAMIN 1000 MCG/ML IJ SOLN
1000.0000 ug | Freq: Once | INTRAMUSCULAR | Status: AC
  Administered 2021-04-25: 16:00:00 1000 ug via INTRAMUSCULAR
  Filled 2021-04-25: qty 1

## 2021-04-25 MED ORDER — DOCUSATE SODIUM 100 MG PO CAPS: 100.0000 mg | ORAL_CAPSULE | Freq: Every day | ORAL | Status: DC | PRN

## 2021-04-25 MED ORDER — POLYETHYLENE GLYCOL 3350 17 G PO PACK: 17.0000 g | PACK | Freq: Every day | ORAL | Status: DC | PRN

## 2021-04-25 NOTE — Progress Notes (Signed)
Subjective: Had 1 episode of leg spasm yesterday.  Was given IV loading dose of fosphenytoin after half dose stopped due to severe itching and pelvic paresthesias. Was getting up out of bed this morning and felt onset of spasms and went back to bed and was able to stop the progression.  ROS: negative except above  Examination  Vital signs in last 24 hours: Temp:  [97.7 F (36.5 C)-98.5 F (36.9 C)] 97.9 F (36.6 C) (12/24 0833) Pulse Rate:  [79-98] 89 (12/24 1054) Resp:  [15-18] 15 (12/24 0833) BP: (84-107)/(48-74) 102/74 (12/24 1054) SpO2:  [97 %-100 %] 99 % (12/24 0833)  General: lying in bed, NAD CVS: pulse-normal rate and rhythm RS: breathing comfortably, CTAB Extremities: warm, no edema   Neuro: MS: Alert, oriented, follows commands CN: pupils equal and reactive,  EOMI, face symmetric, tongue midline, normal sensation over face, Motor: 5/5 strength in all 4 extremities  Basic Metabolic Panel: Recent Labs  Lab 04/20/21 1009 04/22/21 1310 04/23/21 0349 04/25/21 0944  NA 137 137 137 139  K 4.2 3.9 3.9 4.0  CL 105 103 106 108  CO2 24 23 25 25   GLUCOSE 111* 84 91 92  BUN 7 8 14  <5*  CREATININE 0.56 0.73 0.52 0.51  CALCIUM 9.3 9.5 8.6* 8.7*  MG  --   --  2.0  --   PHOS  --   --  4.6  --      CBC: Recent Labs  Lab 04/20/21 1009 04/22/21 1310 04/25/21 0944  WBC 9.7 11.3* 6.9  NEUTROABS 6.3  --   --   HGB 12.7 12.8 11.7*  HCT 39.0 39.3 35.7*  MCV 94.9 94.9 93.7  PLT 402* 386 277   Coagulation Studies: No results for input(s): LABPROT, INR in the last 72 hours.  Imaging No new brain imaging overnight  ASSESSMENT AND PLAN:  40 year old female with multiple sclerosis presented with episodes of right face twitching, upper extremity and lower extremity stiffening. This morning she had warning sensation which abated when she got back in bed and crossed her legs.  We discussed differential diagnosis and she was understandably concerned about a diagnosis of  seizure if these events are spasms related to MS. Since the MRI was not done with epilepsy protocol we will repeat the MRI without contrast using 3T with epilepsy protocol.    Transient right-sided stiffening Relapsing remitting multiple sclerosis -EEG did not show any ictal-interictal activity.  However focal motor seizures may not be seen on scalp EEG. -Differential for parent episodes includes muscle spasms versus focal seizures   Recommendations -3T MRI brain epilepsy protocol - Ordered. -1mg  lorazepam IV as needed for anxiety prior to MRI. -We will continue oxcarbazepine 150 mg twice daily -Will also discuss an anxiolytic such as clonazepam which may also be helpful for seizure and muscle spasm/spasticity. -Will hold off on gabapentin while we use oxcarbazepine to avoid confounding factors -Management of rest of comorbidities per primary team   I have spent a total of 40 minutes with the patient reviewing hospital notes,  test results, labs and examining the patient as well as establishing an assessment and plan that was discussed personally with the patient.  > 50% of time was spent in direct patient care.    04/27/21, MD Stroke Neurology Page: [phone]

## 2021-04-25 NOTE — Progress Notes (Signed)
°   04/24/21 2055  Assess: MEWS Score  Temp 97.7 F (36.5 C)  BP (!) 84/57  Pulse Rate 95  ECG Heart Rate 95  Resp 18  SpO2 100 %  O2 Device Room Air  Patient Activity (if Appropriate) In bed  Assess: MEWS Score  MEWS Temp 0  MEWS Systolic 1  MEWS Pulse 0  MEWS RR 0  MEWS LOC 0  MEWS Score 1  MEWS Score Color Green  Notify: Provider  Provider Marni Griffon, MD  Date Provider Notified 04/24/21  Time Provider Notified 2135  Notification Type Page  Notification Reason Other (Comment) (Continued hypotension and new lightheadedness.)  Provider response See new orders (LR 1L IV bolus.)  Date of Provider Response 04/24/21  Time of Provider Response 2137 (Secure chat.)   At shift change, NS IV bolus noted to be finishing. Day shift RN disconnected IV line. This RN not finding any documentation of this in [facility] or notes.  LR bolus now done. BP 93 56, HR 80. Patient states that lightheadedness has slightly improved. Will continue to monitor.

## 2021-04-25 NOTE — Evaluation (Signed)
Physical Therapy Evaluation & Discharge Patient Details Name: Janice Rose MRN: [id number] DOB: 1980-11-09 Today's Date: 04/25/2021  History of Present Illness  Pt is a 40 y.o. female admitted 04/22/21 with R-side muscle spasms; suspect MS flare. EEG did not show any ictal-interictal activity; however focal motor seizures may not be seen on scalp EEG as per neurology. PMH includes MS; of note, recent admission 03/2021 with MS flare.   Clinical Impression  Patient evaluated by Physical Therapy with no further acute PT needs identified. PTA, pt independent, active, lives with family. Today, pt independent with mobility and ADL tasks; denies muscle spasms with activity, but does endorse generalized LE soreness/fatigue. All education has been completed and the patient has no further questions; encouraged more frequent OOB activity. Acute PT is signing off. Thank you for this referral.  Post-ambulation BP 108/71, HR 109, SpO2 99% on RA     Recommendations for follow up therapy are one component of a multi-disciplinary discharge planning process, led by the attending physician.  Recommendations may be updated based on patient status, additional functional criteria and insurance authorization.  Follow Up Recommendations No PT follow up    Assistance Recommended at Discharge None  Functional Status Assessment    Equipment Recommendations  None recommended by PT    Recommendations for Other Services       Precautions / Restrictions Precautions Precautions: Fall Restrictions Weight Bearing Restrictions: No      Mobility  Bed Mobility Overal bed mobility: Modified Independent             General bed mobility comments: HOB elevated, sister providing HHA    Transfers Overall transfer level: Independent Equipment used: None               General transfer comment: pt provided initial HHA, pt able to progress to indep without UE support    Ambulation/Gait Ambulation/Gait  assistance: Supervision Gait Distance (Feet): 620 Feet Assistive device: 1 person hand held assist;None Gait Pattern/deviations: Step-through pattern;Decreased stride length Gait velocity: Decreased     General Gait Details: Initially slow, cautious gait with HHA and min guard for balance, progressing to no UE support, pt remains cautions and endorses fear of falling; supervision for safety, no overt instability or LOB noted. Pt endorses BLE fatigue and SOB towards end of walk (no DOE noted); denies lightheadedness  Stairs            Wheelchair Mobility    Modified Rankin (Stroke Patients Only)       Balance Overall balance assessment: No apparent balance deficits (not formally assessed)                                           Pertinent Vitals/Pain Pain Assessment: Faces Faces Pain Scale: Hurts little more Pain Location: LLE cramping, bilateral knees and ankles with ambulation Pain Descriptors / Indicators: Sore Pain Intervention(s): Monitored during session    Home Living Family/patient expects to be discharged to:: Private residence Living Arrangements: Spouse/significant other Available Help at Discharge: Family;Available 24 hours/day Type of Home: Apartment Home Access: Level entry       Home Layout: One level Home Equipment: Agricultural consultant (2 wheels) Additional Comments: Lives with husband and two kids (6 and 76 y.o.); supportive mother and sister    Prior Function Prior Level of Function : Independent/Modified Independent;Driving  Hand Dominance        Extremity/Trunk Assessment   Upper Extremity Assessment Upper Extremity Assessment: Overall WFL for tasks assessed;RUE deficits/detail RUE Deficits / Details: pt denies spasms during session; RUE gross ROM WFL and strength >3/5 throughout, pt endorses palm feeling "hot"; denies numbness/tingling    Lower Extremity Assessment Lower Extremity Assessment:  Overall WFL for tasks assessed;RLE deficits/detail RLE Deficits / Details: pt denies spasms during session; RLE gross ROM WFL and strength >3/5 throughout; denies numbness/tingling    Cervical / Trunk Assessment Cervical / Trunk Assessment: Normal  Communication   Communication: No difficulties  Cognition Arousal/Alertness: Awake/alert Behavior During Therapy: WFL for tasks assessed/performed Overall Cognitive Status: Within Functional Limits for tasks assessed                                          General Comments General comments (skin integrity, edema, etc.): Pt with great awareness of MS symptoms, limitations and activity pacing; motivated to participate and mobilize more. Provided squeeze ball for R hand strengthening, per request. Supportive mother and sister present. Post-ambulation 108/71 (pt reports baseline SBP 90s), HR 109, SpO2 99% on RA    Exercises Other Exercises Other Exercises: encouraged RUE (specifically elbow/wrist) AROM extension   Assessment/Plan    PT Assessment Patient does not need any further PT services  PT Problem List         PT Treatment Interventions      PT Goals (Current goals can be found in the Care Plan section)  Acute Rehab PT Goals PT Goal Formulation: All assessment and education complete, DC therapy    Frequency     Barriers to discharge        Co-evaluation               AM-PAC PT "6 Clicks" Mobility  Outcome Measure Help needed turning from your back to your side while in a flat bed without using bedrails?: None Help needed moving from lying on your back to sitting on the side of a flat bed without using bedrails?: None Help needed moving to and from a bed to a chair (including a wheelchair)?: None Help needed standing up from a chair using your arms (e.g., wheelchair or bedside chair)?: None Help needed to walk in hospital room?: None Help needed climbing 3-5 steps with a railing? : None 6 Click  Score: 24    End of Session   Activity Tolerance: Patient tolerated treatment well Patient left: in bed;with call bell/phone within reach;with family/visitor present Nurse Communication: Mobility status PT Visit Diagnosis: Other abnormalities of gait and mobility (R26.89)    Time: 0932-6712 PT Time Calculation (min) (ACUTE ONLY): 20 min   Charges:   PT Evaluation $PT Eval Low Complexity: 1 Low     Janice Rose, PT, DPT Acute Rehabilitation Services  Pager [phone] Office [phone]  Janice Rose 04/25/2021, 4:47 PM

## 2021-04-25 NOTE — Progress Notes (Signed)
Pt would like to wait on MRI until they talk to neurologist tomorrow.

## 2021-04-25 NOTE — Progress Notes (Signed)
PROGRESS NOTE    CHRISTIAN TREADWAY  HEN:[id number] DOB: 07-02-1980 DOA: 04/22/2021 PCP: Lucianne Lei, MD   Brief Narrative:  40 year old female with medical history of multiple sclerosis, presented with right side muscle spasms.  Patient recently was admitted to hospital in November for right-sided paresthesia felt due to multiple sclerosis flare.  She was treated with high-dose Solu-Medrol for 5 days and was discharged with outpatient neurology follow-up.  On 04/18/2021 she developed new right-sided muscle spasm involving right face, right arm and right leg.  The spasm would call right arm to contract medially in a flexed position because her right leg to contract in full extension.  She came to ED on 12/19 with persistent symptoms Case was discussed with neurology and patient started on gabapentin 300 mg 3 times daily.  Patient says that she took gabapentin however the episodes became worse after the medication.  She came to ED.  Neurology was consulted.  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  Neurology consulted recommended EEG monitoring.  No evidence of seizure on EEG however neurology feels that she could have focal motor seizures which cannot be picked up on the LTM EEG.  Patient started on oxcarbazepine 150 mg twice a day.  Gabapentin was discontinued.    Assessment & Plan:  Right-sided muscles spasm/stiffening: -In the setting of multiple sclerosis -EEG did not show any ictal-interictal activity.  However focal motor seizures may not be seen on scalp EEG as per neurology. -Gabapentin discontinued.  Currently on oxcarbazepine 150 mg p.o. twice daily. -Appreciate neurology recommendations -Consult PT  History of multiple sclerosis: -Patient had relapse of MS in 2022.  MRI brain showed improvement since last month.  MRI cervical spine showed no acute abnormality. -Outpatient  neurology follow-up.  Hypotension: -BP is stable this morning. -CBC, BMP: Stable at baseline. -Dizziness could be due to oxcarbazepine -Encourage increased hydration -Monitor blood pressure closely  Vitamin D deficiency: -Started on vitamin D 50,000 capsules q. Weekly  B12 deficiency: -Checked last month.  B12 injection IM once given -Recommend to follow-up with PCP outpatient  Blood in stool: -1 episode this morning. -Has history of hemorrhoids. -H&H is stable.  Continue to monitor.  Stool softener as needed  DVT prophylaxis: Lovenox Code Status: Full code Family Communication: Patient's mom present at bedside.  Plan of care discussed with patient in length and he verbalized understanding and agreed with it.  Consultants:  Neurology  Procedures:  EEG  Antimicrobials:  None  Status is: Inpatient    Subjective: Patient seen and examined.  Mom at the bedside.  Patient tells me that she could not sleep all night due to cold in the room.  Charge nurse is working to switch her room.  She feels pain in her legs due to cold.  She has some dizziness this morning.  Her blood pressure is better as compared to previously readings.  Denies nausea, vomiting, slurred speech, headache, blurry vision, fever, chills, generalized weakness or lethargy.  Objective: Vitals:   04/25/21 0432 04/25/21 0833 04/25/21 1054 04/25/21 1233  BP: (!) 96/57 100/63 102/74 104/66  Pulse:  80 89 93  Resp:  15  20  Temp:  97.9 F (36.6 C)  98 F (36.7 C)  TempSrc:  Oral  Oral  SpO2: 97% 99%  99%  Weight:      Height:        Intake/Output Summary (Last 24 hours) at  04/25/2021 1335 Last data filed at 04/24/2021 2310 Gross per 24 hour  Intake 1480 ml  Output 300 ml  Net 1180 ml   Filed Weights   04/23/21 1155  Weight: 58.1 kg    Examination:  General exam: Appears calm and comfortable, on room air, eating breakfast, communicating well, mom at the bedside. Respiratory system: Clear to  auscultation. Respiratory effort normal. Cardiovascular system: S1 & S2 heard, RRR. No JVD, murmurs, rubs, gallops or clicks. No pedal edema. Gastrointestinal system: Abdomen is nondistended, soft and nontender. No organomegaly or masses felt. Normal bowel sounds heard. Central nervous system: Alert and oriented. No focal neurological deficits. Extremities: Symmetric 5 x 5 power. Skin: No rashes, lesions or ulcers Psychiatry: Judgement and insight appear normal. Mood & affect appropriate.    Data Reviewed: I have personally reviewed following labs and imaging studies  CBC: Recent Labs  Lab 04/20/21 1009 04/22/21 1310 04/25/21 0944  WBC 9.7 11.3* 6.9  NEUTROABS 6.3  --   --   HGB 12.7 12.8 11.7*  HCT 39.0 39.3 35.7*  MCV 94.9 94.9 93.7  PLT 402* 386 277   Basic Metabolic Panel: Recent Labs  Lab 04/20/21 1009 04/22/21 1310 04/23/21 0349 04/25/21 0944  NA 137 137 137 139  K 4.2 3.9 3.9 4.0  CL 105 103 106 108  CO2 24 23 25 25   GLUCOSE 111* 84 91 92  BUN 7 8 14  <5*  CREATININE 0.56 0.73 0.52 0.51  CALCIUM 9.3 9.5 8.6* 8.7*  MG  --   --  2.0  --   PHOS  --   --  4.6  --    GFR: Estimated Creatinine Clearance: 73.2 mL/min (by C-G formula based on SCr of 0.51 mg/dL). Liver Function Tests: Recent Labs  Lab 04/22/21 1310  AST 22  ALT 17  ALKPHOS 62  BILITOT 0.6  PROT 7.6  ALBUMIN 4.0   No results for input(s): LIPASE, AMYLASE in the last 168 hours. No results for input(s): AMMONIA in the last 168 hours. Coagulation Profile: No results for input(s): INR, PROTIME in the last 168 hours. Cardiac Enzymes: Recent Labs  Lab 04/23/21 0349  CKTOTAL 22*   BNP (last 3 results) No results for input(s): PROBNP in the last 8760 hours. HbA1C: No results for input(s): HGBA1C in the last 72 hours. CBG: No results for input(s): GLUCAP in the last 168 hours. Lipid Profile: No results for input(s): CHOL, HDL, LDLCALC, TRIG, CHOLHDL, LDLDIRECT in the last 72 hours. Thyroid  Function Tests: No results for input(s): TSH, T4TOTAL, FREET4, T3FREE, THYROIDAB in the last 72 hours. Anemia Panel: No results for input(s): VITAMINB12, FOLATE, FERRITIN, TIBC, IRON, RETICCTPCT in the last 72 hours. Sepsis Labs: No results for input(s): PROCALCITON, LATICACIDVEN in the last 168 hours.  Recent Results (from the past 240 hour(s))  Resp Panel by RT-PCR (Flu A&B, Covid) Nasopharyngeal Swab     Status: None   Collection Time: 04/22/21  8:02 PM   Specimen: Nasopharyngeal Swab; Nasopharyngeal(NP) swabs in vial transport medium  Result Value Ref Range Status   SARS Coronavirus 2 by RT PCR NEGATIVE NEGATIVE Final    Comment: (NOTE) SARS-CoV-2 target nucleic acids are NOT DETECTED.  The SARS-CoV-2 RNA is generally detectable in upper respiratory specimens during the acute phase of infection. The lowest concentration of SARS-CoV-2 viral copies this assay can detect is 138 copies/mL. A negative result does not preclude SARS-Cov-2 infection and should not be used as the sole basis for treatment or other  patient management decisions. A negative result may occur with  improper specimen collection/handling, submission of specimen other than nasopharyngeal swab, presence of viral mutation(s) within the areas targeted by this assay, and inadequate number of viral copies(<138 copies/mL). A negative result must be combined with clinical observations, patient history, and epidemiological information. The expected result is Negative.  Fact Sheet for Patients:  BloggerCourse.com  Fact Sheet for Healthcare Providers:  SeriousBroker.it  This test is no t yet approved or cleared by the [place] FDA and  has been authorized for detection and/or diagnosis of SARS-CoV-2 by FDA under an Emergency Use Authorization (EUA). This EUA will remain  in effect (meaning this test can be used) for the duration of the COVID-19 declaration under  Section 564(b)(1) of the Act, 21 U.S.C.section 360bbb-3(b)(1), unless the authorization is terminated  or revoked sooner.       Influenza A by PCR NEGATIVE NEGATIVE Final   Influenza B by PCR NEGATIVE NEGATIVE Final    Comment: (NOTE) The Xpert Xpress SARS-CoV-2/FLU/RSV plus assay is intended as an aid in the diagnosis of influenza from Nasopharyngeal swab specimens and should not be used as a sole basis for treatment. Nasal washings and aspirates are unacceptable for Xpert Xpress SARS-CoV-2/FLU/RSV testing.  Fact Sheet for Patients: BloggerCourse.com  Fact Sheet for Healthcare Providers: SeriousBroker.it  This test is not yet approved or cleared by the [place] FDA and has been authorized for detection and/or diagnosis of SARS-CoV-2 by FDA under an Emergency Use Authorization (EUA). This EUA will remain in effect (meaning this test can be used) for the duration of the COVID-19 declaration under Section 564(b)(1) of the Act, 21 U.S.C. section 360bbb-3(b)(1), unless the authorization is terminated or revoked.  Performed at [facility], [address] [place], [place] [address]       Radiology Studies: No results found.  Scheduled Meds:  cyanocobalamin  1,000 mcg Intramuscular Once   enoxaparin (LOVENOX) injection  40 mg Subcutaneous Q24H   OXcarbazepine  150 mg Oral BID   Vitamin D (Ergocalciferol)  50,000 Units Oral Q7 days   Continuous Infusions:   LOS: 2 days   Time spent: 40 minutes   Brandin Stetzer Estill Cotta, MD [facility]  If 7PM-7AM, please contact night-coverage [email or web address] 04/25/2021, 1:35 PM

## 2021-04-26 DIAGNOSIS — M62838 Other muscle spasm: Secondary | ICD-10-CM | POA: Diagnosis not present

## 2021-04-26 LAB — CBC
HCT: 32.5 % — ABNORMAL LOW (ref 36.0–46.0)
Hemoglobin: 10.5 g/dL — ABNORMAL LOW (ref 12.0–15.0)
MCH: 30.5 pg (ref 26.0–34.0)
MCHC: 32.3 g/dL (ref 30.0–36.0)
MCV: 94.5 fL (ref 80.0–100.0)
Platelets: 265 10*3/uL (ref 150–400)
RBC: 3.44 MIL/uL — ABNORMAL LOW (ref 3.87–5.11)
RDW: 14.1 % (ref 11.5–15.5)
WBC: 9.1 10*3/uL (ref 4.0–10.5)
nRBC: 0 % (ref 0.0–0.2)

## 2021-04-26 LAB — BASIC METABOLIC PANEL
Anion gap: 6 (ref 5–15)
BUN: 6 mg/dL (ref 6–20)
CO2: 22 mmol/L (ref 22–32)
Calcium: 8.2 mg/dL — ABNORMAL LOW (ref 8.9–10.3)
Chloride: 106 mmol/L (ref 98–111)
Creatinine, Ser: 0.49 mg/dL (ref 0.44–1.00)
GFR, Estimated: >60 mL/min (ref >60)
Glucose, Bld: 185 mg/dL — ABNORMAL HIGH (ref 70–99)
Potassium: 3.3 mmol/L — ABNORMAL LOW (ref 3.5–5.1)
Sodium: 134 mmol/L — ABNORMAL LOW (ref 135–145)

## 2021-04-26 LAB — MAGNESIUM: Magnesium: 1.9 mg/dL (ref 1.7–2.4)

## 2021-04-26 MED ORDER — CLONAZEPAM 0.5 MG PO TBDP: 1.0000 mg | ORAL_TABLET | Freq: Two times a day (BID) | ORAL | Status: DC

## 2021-04-26 MED ORDER — OXCARBAZEPINE 150 MG PO TABS
150.0000 mg | ORAL_TABLET | Freq: Two times a day (BID) | ORAL | Status: DC
  Administered 2021-04-26 – 2021-04-27 (×2): 150 mg via ORAL
  Filled 2021-04-26 (×3): qty 1

## 2021-04-26 MED ORDER — POTASSIUM CHLORIDE 20 MEQ PO PACK
40.0000 meq | PACK | Freq: Once | ORAL | Status: AC
  Administered 2021-04-26: 10:00:00 40 meq via ORAL
  Filled 2021-04-26: qty 2

## 2021-04-26 MED ORDER — GABAPENTIN 300 MG PO CAPS: 600.0000 mg | ORAL_CAPSULE | Freq: Three times a day (TID) | ORAL | Status: DC

## 2021-04-26 NOTE — Progress Notes (Signed)
RN + MD + Patient family at bedside. Patient appears calm and free of pain. Patient does not report neurological deficiencies at this time. Patient refuses MRI at this time. Consent NOT obtained. MD aware. Will continue to monitor.

## 2021-04-26 NOTE — Progress Notes (Addendum)
PROGRESS NOTE    Janice Rose  IDP:[id number] DOB: 1980-10-04 DOA: 04/22/2021 PCP: Lucianne Lei, MD   Brief Narrative:  40 year old female with medical history of multiple sclerosis, presented with right side muscle spasms.  Patient recently was admitted to hospital in November for right-sided paresthesia felt due to multiple sclerosis flare.  She was treated with high-dose Solu-Medrol for 5 days and was discharged with outpatient neurology follow-up.  On 04/18/2021 she developed new right-sided muscle spasm involving right face, right arm and right leg.  The spasm would call right arm to contract medially in a flexed position because her right leg to contract in full extension.  She came to ED on 12/19 with persistent symptoms Case was discussed with neurology and patient started on gabapentin 300 mg 3 times daily.  Patient says that she took gabapentin however the episodes became worse after the medication.  She came to ED.  Neurology was consulted.  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  Neurology consulted recommended EEG monitoring.  No evidence of seizure on EEG however neurology feels that she could have focal motor seizures which cannot be picked up on the LTM EEG.  Patient started on oxcarbazepine 150 mg twice a day.  Gabapentin was discontinued.    Assessment & Plan:  Right-sided muscles spasm/stiffening: -In the setting of multiple sclerosis -EEG did not show any ictal-interictal activity.  However focal motor seizures may not be seen on scalp EEG as per neurology. -Gabapentin discontinued.  Currently on oxcarbazepine 150 mg p.o. twice daily. -Consult PT-recommend no PT f/u -Appreciate neurology recommendations-recommended 3T MRI brain epilepsy protocol however patient refused at this time -Discussed with on-call neurology Dr. Orpah Clinton: Discontinue Trileptal, started on  gabapentin 600 mg 3 times daily and Klonopin 1 mg twice daily scheduled.  History of multiple sclerosis: -Patient had relapse of MS in 2022.  MRI brain showed improvement since last month.  MRI cervical spine showed no acute abnormality. -Outpatient neurology follow-up.  Hypotension: -BP is in the 90s-100s over 50s-60s -Encourage increased hydration -Monitor blood pressure closely  Hypokalemia: Replenished. -Magnesium: WNL.  Repeat BMP tomorrow a.m.  Vitamin D deficiency: -Started on vitamin D 50,000 capsules q. Weekly  B12 deficiency: -Checked last month.  B12 level: 100.  B12 injection IM once given 12/24, she gets once a month by her neurologist -Follow-up outpatient..  Blood in stool: -1 episode on 04/25/2021 -Has history of hemorrhoids. -H&H i 10.5/32.5 was 10.8/33.4 last month.  No Per rectal bleeding this morning.  Continue to monitor.  Stool softener as needed  DVT prophylaxis: Lovenox Code Status: Full code Family Communication: Patient's husband present at bedside.  Plan of care discussed with patient in length and she verbalized understanding and agreed with it.  Consultants:  Neurology  Procedures:  EEG  Antimicrobials:  None  Status is: Inpatient    Subjective: Patient seen and examined.  Patient's husband at the bedside.  Patient is concerned about her symptoms and upset due to her overall situation (all work-up is so far negative for her symptoms).  I had a long discussion with patient and her husband at the bedside.  Patient does not want MRI with epilepsy protocol at this time as she is scared to be "labeled" with seizure disorder.  She does not want to continue oxcarbazepine since her EEG resulted negative for seizures.  She is willing to try higher dose of gabapentin along with antianxiety  (  likely Klonopin versus Valium )to see if it is helpful.   Objective: Vitals:   04/25/21 2357 04/26/21 0345 04/26/21 0802 04/26/21 1145  BP: (!) 97/57 93/61 (!)  92/59 109/67  Pulse: 89 83 83 98  Resp: 20 20 20 20   Temp: 97.8 F (36.6 C) 98.3 F (36.8 C) 98 F (36.7 C) 97.7 F (36.5 C)  TempSrc: Oral Oral Oral Oral  SpO2: 97% 97% 100% 97%  Weight:      Height:       No intake or output data in the 24 hours ending 04/26/21 1201  Filed Weights   04/23/21 1155  Weight: 58.1 kg    Examination:  General exam: Appears calm and comfortable, on room air, husband at the bedside communicating well Respiratory system: Clear to auscultation. Respiratory effort normal. Cardiovascular system: S1 & S2 heard, RRR. No JVD, murmurs, rubs, gallops or clicks. No pedal edema. Gastrointestinal system: Abdomen is nondistended, soft and nontender. No organomegaly or masses felt. Normal bowel sounds heard. Central nervous system: Alert and oriented. No focal neurological deficits. Extremities: Symmetric 5 x 5 power. Skin: No rashes, lesions or ulcers Psychiatry: Judgement and insight appear normal. Mood & affect appropriate.    Data Reviewed: I have personally reviewed following labs and imaging studies  CBC: Recent Labs  Lab 04/20/21 1009 04/22/21 1310 04/25/21 0944 04/26/21 0040  WBC 9.7 11.3* 6.9 9.1  NEUTROABS 6.3  --   --   --   HGB 12.7 12.8 11.7* 10.5*  HCT 39.0 39.3 35.7* 32.5*  MCV 94.9 94.9 93.7 94.5  PLT 402* 386 277 265    Basic Metabolic Panel: Recent Labs  Lab 04/20/21 1009 04/22/21 1310 04/23/21 0349 04/25/21 0944 04/26/21 0040  NA 137 137 137 139 134*  K 4.2 3.9 3.9 4.0 3.3*  CL 105 103 106 108 106  CO2 24 23 25 25 22   GLUCOSE 111* 84 91 92 185*  BUN 7 8 14  <5* 6  CREATININE 0.56 0.73 0.52 0.51 0.49  CALCIUM 9.3 9.5 8.6* 8.7* 8.2*  MG  --   --  2.0  --  1.9  PHOS  --   --  4.6  --   --     GFR: Estimated Creatinine Clearance: 73.2 mL/min (by C-G formula based on SCr of 0.49 mg/dL). Liver Function Tests: Recent Labs  Lab 04/22/21 1310  AST 22  ALT 17  ALKPHOS 62  BILITOT 0.6  PROT 7.6  ALBUMIN 4.0    No  results for input(s): LIPASE, AMYLASE in the last 168 hours. No results for input(s): AMMONIA in the last 168 hours. Coagulation Profile: No results for input(s): INR, PROTIME in the last 168 hours. Cardiac Enzymes: Recent Labs  Lab 04/23/21 0349  CKTOTAL 22*    BNP (last 3 results) No results for input(s): PROBNP in the last 8760 hours. HbA1C: No results for input(s): HGBA1C in the last 72 hours. CBG: No results for input(s): GLUCAP in the last 168 hours. Lipid Profile: No results for input(s): CHOL, HDL, LDLCALC, TRIG, CHOLHDL, LDLDIRECT in the last 72 hours. Thyroid Function Tests: No results for input(s): TSH, T4TOTAL, FREET4, T3FREE, THYROIDAB in the last 72 hours. Anemia Panel: No results for input(s): VITAMINB12, FOLATE, FERRITIN, TIBC, IRON, RETICCTPCT in the last 72 hours. Sepsis Labs: No results for input(s): PROCALCITON, LATICACIDVEN in the last 168 hours.  Recent Results (from the past 240 hour(s))  Resp Panel by RT-PCR (Flu A&B, Covid) Nasopharyngeal Swab     Status:  None   Collection Time: 04/22/21  8:02 PM   Specimen: Nasopharyngeal Swab; Nasopharyngeal(NP) swabs in vial transport medium  Result Value Ref Range Status   SARS Coronavirus 2 by RT PCR NEGATIVE NEGATIVE Final    Comment: (NOTE) SARS-CoV-2 target nucleic acids are NOT DETECTED.  The SARS-CoV-2 RNA is generally detectable in upper respiratory specimens during the acute phase of infection. The lowest concentration of SARS-CoV-2 viral copies this assay can detect is 138 copies/mL. A negative result does not preclude SARS-Cov-2 infection and should not be used as the sole basis for treatment or other patient management decisions. A negative result may occur with  improper specimen collection/handling, submission of specimen other than nasopharyngeal swab, presence of viral mutation(s) within the areas targeted by this assay, and inadequate number of viral copies(<138 copies/mL). A negative result  must be combined with clinical observations, patient history, and epidemiological information. The expected result is Negative.  Fact Sheet for Patients:  BloggerCourse.com  Fact Sheet for Healthcare Providers:  SeriousBroker.it  This test is no t yet approved or cleared by the [place] FDA and  has been authorized for detection and/or diagnosis of SARS-CoV-2 by FDA under an Emergency Use Authorization (EUA). This EUA will remain  in effect (meaning this test can be used) for the duration of the COVID-19 declaration under Section 564(b)(1) of the Act, 21 U.S.C.section 360bbb-3(b)(1), unless the authorization is terminated  or revoked sooner.       Influenza A by PCR NEGATIVE NEGATIVE Final   Influenza B by PCR NEGATIVE NEGATIVE Final    Comment: (NOTE) The Xpert Xpress SARS-CoV-2/FLU/RSV plus assay is intended as an aid in the diagnosis of influenza from Nasopharyngeal swab specimens and should not be used as a sole basis for treatment. Nasal washings and aspirates are unacceptable for Xpert Xpress SARS-CoV-2/FLU/RSV testing.  Fact Sheet for Patients: BloggerCourse.com  Fact Sheet for Healthcare Providers: SeriousBroker.it  This test is not yet approved or cleared by the [place] FDA and has been authorized for detection and/or diagnosis of SARS-CoV-2 by FDA under an Emergency Use Authorization (EUA). This EUA will remain in effect (meaning this test can be used) for the duration of the COVID-19 declaration under Section 564(b)(1) of the Act, 21 U.S.C. section 360bbb-3(b)(1), unless the authorization is terminated or revoked.  Performed at [facility], [address] [place], [place] [address]        Radiology Studies: No results found.  Scheduled Meds:  enoxaparin (LOVENOX) injection  40 mg Subcutaneous Q24H   OXcarbazepine  150 mg Oral BID    Vitamin D (Ergocalciferol)  50,000 Units Oral Q7 days   Continuous Infusions:   LOS: 3 days   Time spent: 60 minutes   Melika Reder Estill Cotta, MD [facility]  If 7PM-7AM, please contact night-coverage [email or web address] 04/26/2021, 12:01 PM

## 2021-04-26 NOTE — Progress Notes (Signed)
RN and patient family at bedside. Patient appears calm and at rest. Patient requests to speak with neurology services regarding patient care. Consulting civil engineer notified. Several unsuccessful attempts made to contact neurology services. Attending MD notified. Will continue to monitor

## 2021-04-27 DIAGNOSIS — R252 Cramp and spasm: Secondary | ICD-10-CM | POA: Diagnosis not present

## 2021-04-27 DIAGNOSIS — G35 Multiple sclerosis: Secondary | ICD-10-CM | POA: Diagnosis not present

## 2021-04-27 DIAGNOSIS — M62838 Other muscle spasm: Secondary | ICD-10-CM | POA: Diagnosis not present

## 2021-04-27 LAB — BASIC METABOLIC PANEL
Anion gap: 9 (ref 5–15)
BUN: 8 mg/dL (ref 6–20)
CO2: 22 mmol/L (ref 22–32)
Calcium: 8.6 mg/dL — ABNORMAL LOW (ref 8.9–10.3)
Chloride: 107 mmol/L (ref 98–111)
Creatinine, Ser: 0.5 mg/dL (ref 0.44–1.00)
GFR, Estimated: >60 mL/min (ref >60)
Glucose, Bld: 96 mg/dL (ref 70–99)
Potassium: 3.7 mmol/L (ref 3.5–5.1)
Sodium: 138 mmol/L (ref 135–145)

## 2021-04-27 LAB — CBC
HCT: 34 % — ABNORMAL LOW (ref 36.0–46.0)
Hemoglobin: 11.2 g/dL — ABNORMAL LOW (ref 12.0–15.0)
MCH: 30.7 pg (ref 26.0–34.0)
MCHC: 32.9 g/dL (ref 30.0–36.0)
MCV: 93.2 fL (ref 80.0–100.0)
Platelets: 265 10*3/uL (ref 150–400)
RBC: 3.65 MIL/uL — ABNORMAL LOW (ref 3.87–5.11)
RDW: 13.9 % (ref 11.5–15.5)
WBC: 8.7 10*3/uL (ref 4.0–10.5)
nRBC: 0 % (ref 0.0–0.2)

## 2021-04-27 LAB — MAGNESIUM: Magnesium: 2.2 mg/dL (ref 1.7–2.4)

## 2021-04-27 MED ORDER — OXCARBAZEPINE 150 MG PO TABS: 150.0000 mg | ORAL_TABLET | Freq: Two times a day (BID) | ORAL | 0 refills | Status: DC

## 2021-04-27 MED ORDER — LORAZEPAM 1 MG PO TABS: 2.0000 mg | ORAL_TABLET | Freq: Every day | ORAL | 0 refills | Status: DC | PRN

## 2021-04-27 NOTE — Progress Notes (Signed)
Discharge paperwork reviewed with pt. Pt verbalized understanding. Pt alert and oriented x4 in no acute distress. Pt's husband to transport home. Pt wheeled to main entrance via wheelchair by nurse. Pt has taken her belongings.

## 2021-04-27 NOTE — Discharge Summary (Signed)
Physician Discharge Summary  KISCHA ALTICE DTO:[id number] DOB: [birth year] DOA: 04/22/2021  PCP: Lucianne Lei, MD  Admit date: 04/22/2021 Discharge date: 04/27/2021  Admitted From: Home Disposition:  Home  Recommendations for Outpatient Follow-up:  Follow up with PCP in 1-2 weeks Please obtain BMP/CBC in one week Follow-up with neurology in 12 weeks  Home Health: None Equipment/Devices: None Discharge Condition: Stable CODE STATUS: Full code Diet recommendation: Regular diet  Brief/Interim Summary: 40 year old female with medical history of multiple sclerosis, presented with right side muscle spasms.  Patient recently was admitted to hospital in November for right-sided paresthesia felt due to multiple sclerosis flare.  She was treated with high-dose Solu-Medrol for 5 days and was discharged with outpatient neurology follow-up.  On 04/18/2021 she developed new right-sided muscle spasm involving right face, right arm and right leg.  The spasm would call right arm to contract medially in a flexed position because her right leg to contract in full extension.  She came to ED on 12/19 with persistent symptoms Case was discussed with neurology and patient started on gabapentin 300 mg 3 times daily.  Patient says that she took gabapentin however the episodes became worse after the medication.  She came to ED.  Neurology was consulted.  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  Neurology consulted recommended EEG monitoring.  No evidence of seizure on EEG however neurology feels that she could have focal motor seizures which cannot be picked up on the LTM EEG.  Patient started on oxcarbazepine 150 mg twice a day.  Gabapentin was discontinued.    Transient right-sided muscles spasm/stiffening: -EEG did not show any ictal-interictal activity.  However focal motor seizures may not be seen on scalp  EEG as per neurology.  MRI negative for acute findings. -Neurology consulted-appreciate help -Consulted physical therapy recommended no PT follow-up -Her symptoms improved.  Neurology recommended :discharge patient on oxcarbazepine 150 mg twice daily and Ativan 2 mg as needed for anxiety.  Follow-up with neurology in 12 weeks.   History of multiple sclerosis: -Patient had relapse of MS in 2022.  MRI brain showed improvement since last month.  MRI cervical spine showed no acute abnormality. -Outpatient neurology follow-up.   Hypotension: -BP is in the 90s-100s over 50s-60s -Stable.   Hypokalemia/hyponatremia: Resolved -Follow-up with PCP in 1 to 2 weeks and repeat BMP on follow-up visit  Vitamin D deficiency: -Continued vitamin D 50,000 capsules q. Weekly   B12 deficiency: -Checked last month.  B12 level: 100.  B12 injection IM once given 12/24, she gets once a month by her neurologist -Follow-up outpatient..   Blood in stool: -1 episode on 04/25/2021 -Has history of hemorrhoids. -H&H remained stable.  No further episodes of blood per rectum.  Stool softener as needed  Discharge Diagnoses:  Transient right-sided muscle spasm History of multiple sclerosis Hypertension Hypokalemia Hyponatremia Vitamin D deficiency B12 deficiency Blood in stool/history of hemorrhoids    Discharge Instructions  Discharge Instructions     Diet general   Complete by: As directed    Discharge instructions   Complete by: As directed    Follow-up with PCP in 1 to 2 weeks Follow-up with neurology in 12 weeks Repeat CBC and BMP on follow-up visit with PCP Continue Trileptal 150 mg twice a day and Ativan only as needed for anxiety   Increase activity slowly   Complete by: As directed       Allergies as  of 04/27/2021       Reactions   Phenytoin Itching        Medication List     STOP taking these medications    Dimethyl Fumarate 240 MG Cpdr   gabapentin 300 MG  capsule Commonly known as: NEURONTIN   Vumerity 231 MG Cpdr Generic drug: Diroximel Fumarate       TAKE these medications    cyanocobalamin 1000 MCG/ML injection Commonly known as: (VITAMIN B-12) Inject 1 cc monthly for 6 months.  Please dispense with #6 1 cc syringes and #6 27 g 1" needles   LORazepam 1 MG tablet Commonly known as: Ativan Take 2 tablets (2 mg total) by mouth daily as needed for anxiety.   OXcarbazepine 150 MG tablet Commonly known as: TRILEPTAL Take 1 tablet (150 mg total) by mouth 2 (two) times daily.   Vitamin D (Ergocalciferol) 1.25 MG (50000 UNIT) Caps capsule Commonly known as: DRISDOL Take 1 capsule (50,000 Units total) by mouth every 7 (seven) days.        Follow-up Information     Lucianne Lei, MD Follow up.   Specialty: Internal Medicine Contact information: [address] [place] [address] [phone]                Allergies  Allergen Reactions   Phenytoin Itching    Consultations: Neurology   Procedures/Studies: MR Brain W and Wo Contrast  Result Date: 04/22/2021 CLINICAL DATA:  Multiple sclerosis, severe muscle spasms of the right upper and lower extremities, right face. EXAM: MRI HEAD WITHOUT AND WITH CONTRAST TECHNIQUE: Multiplanar, multiecho pulse sequences of the brain and surrounding structures were obtained without and with intravenous contrast. CONTRAST:  6mL GADAVIST GADOBUTROL 1 MMOL/ML IV SOLN COMPARISON:  Brain MRI 03/14/2021 FINDINGS: Brain: Again seen is confluent FLAIR hyperintensity in the left corona radiata today measuring up to approximately 2.8 cm TV by 2.2 cm AP, decreased in size from 3.8 cm TV by 3.0 cm AP measured at a similar level. Previously seen incomplete ring enhancement of this lesion has markedly improved though not entirely resolved (23-19). Numerous additional foci of FLAIR signal abnormality in the juxtacortical and periventricular white matter in both cerebral hemispheres are  not significantly changed. There is no new abnormal enhancement. Parenchymal volume is normal. The ventricles are normal in size. There is no evidence of acute intracranial hemorrhage, extra-axial fluid collection, or acute infarct. There is no new mass lesion.  There is no midline shift. Vascular: Normal flow voids. Skull and upper cervical spine: Normal marrow signal. Sinuses/Orbits: The paranasal sinuses are clear. The globes and orbits are unremarkable. The optic nerves are grossly unremarkable on these nondedicated sequences. Other: None. IMPRESSION: 1. Decreased size of the tumefactive demyelinating lesion in the left centrum semiovale with nearly resolved enhancement since 03/14/2021. 2. Scattered additional demyelinating lesions in both cerebral hemispheres are unchanged. 3. No new abnormal enhancement. Electronically Signed   By: Lesia Hausen M.D.   On: 04/22/2021 18:20   MR Cervical Spine W or Wo Contrast  Result Date: 04/22/2021 CLINICAL DATA:  Multiple sclerosis, new weakness, muscle spasms of right upper and lower extremities and right base EXAM: MRI CERVICAL SPINE WITHOUT AND WITH CONTRAST TECHNIQUE: Multiplanar and multiecho pulse sequences of the cervical spine, to include the craniocervical junction and cervicothoracic junction, were obtained without and with intravenous contrast. CONTRAST:  6mL GADAVIST GADOBUTROL 1 MMOL/ML IV SOLN COMPARISON:  Cervical spine MRI 03/18/2021 FINDINGS: Alignment: There is slight reversal of the  normal cervical spine lordosis. There is no antero or retrolisthesis. Vertebrae: Vertebral body heights are preserved. There is no marrow signal abnormality. There is no abnormal marrow enhancement. Cord: There is no convincing evidence of cord signal abnormality. There is no abnormal cord enhancement. Posterior Fossa, vertebral arteries, paraspinal tissues: The posterior fossa is assessed on the separately dictated brain MRI. The vertebral artery flow voids are present.  The paraspinal soft tissues are unremarkable. Disc levels: There is mild degenerative change in the cervical spine, most advanced at C5-C6, unchanged since the recent cervical spine MRI. IMPRESSION: No convincing evidence of cord signal abnormality. No abnormal enhancement to suggest active demyelination. Electronically Signed   By: Lesia Hausen M.D.   On: 04/22/2021 18:24   Overnight EEG with video  Result Date: 04/23/2021 Charlsie Quest, MD     04/24/2021  8:36 AM Patient Name: DEMITRA DANLEY MRN: [id number] Epilepsy Attending: Charlsie Quest Referring Physician/Provider: Dr Erick Blinks Duration: 04/23/2021 4098 to 04/24/2021 0137 Patient history:  40 y.o. female with PMH significant for MS not on disease modifying treatment at this time and waiting for prior auth to start a medication who presents with several episodes of R sided spasms involving RUE, RLE and R face.  EEG to evaluate for seizure. Level of alertness: Awake, asleep AEDs during EEG study: GBP Technical aspects: This EEG study was done with scalp electrodes positioned according to the 10-20 International system of electrode placement. Electrical activity was acquired at a sampling rate of  and reviewed with a high frequency filter of  and a low frequency filter of . EEG data were recorded continuously and digitally stored. Description: The posterior dominant rhythm consists of 9 Hz activity of moderate voltage (25-35 uV) seen predominantly in posterior head regions, symmetric and reactive to eye opening and eye closing. Sleep was characterized by vertex waves, sleep spindles (12 to 14 Hz), maximal frontocentral region.  Patient event button was pressed on 04/23/2021 at 0516, 0627 and 0716 for right arm and leg stiffness. Concomitant EEG before, during and after the event did not show any EEG change. Hyperventilation and photic stimulation were not performed.   IMPRESSION: This study is within normal limits. No definite  seizures or epileptiform discharges were seen throughout the recording. Multiple episodes of right arm and leg stiffness were recorded without concomitant EEG change.  However focal motor seizures may not be seen on scalp EEG.  Therefore clinical correlation is recommended. Priyanka Annabelle Harman      Subjective: Patient seen and examined.  Husband at the bedside.  Patient denies any complaints and wishes to go home today.  Discharge Exam: Vitals:   04/27/21 0432 04/27/21 0724  BP: 95/63 105/69  Pulse: 72 76  Resp: 18 17  Temp: 98.3 F (36.8 C) 98.1 F (36.7 C)  SpO2: 96% 97%   Vitals:   04/26/21 2152 04/26/21 2354 04/27/21 0432 04/27/21 0724  BP: 105/69  Pulse:  74 72 76  Resp:  Temp:  98 F (36.7 C) 98.3 F (36.8 C) 98.1 F (36.7 C)  TempSrc:  Oral Oral Oral  SpO2:  95% 96% 97%  Weight:      Height:        General: Pt is alert, awake, not in acute distress Cardiovascular: RRR, S1/S2 +, no rubs, no gallops Respiratory: CTA bilaterally, no wheezing, no rhonchi Abdominal: Soft, NT, ND, bowel sounds + Extremities: no edema, no cyanosis    The  results of significant diagnostics from this hospitalization (including imaging, microbiology, ancillary and laboratory) are listed below for reference.     Microbiology: Recent Results (from the past 240 hour(s))  Resp Panel by RT-PCR (Flu A&B, Covid) Nasopharyngeal Swab     Status: None   Collection Time: 04/22/21  8:02 PM   Specimen: Nasopharyngeal Swab; Nasopharyngeal(NP) swabs in vial transport medium  Result Value Ref Range Status   SARS Coronavirus 2 by RT PCR NEGATIVE NEGATIVE Final    Comment: (NOTE) SARS-CoV-2 target nucleic acids are NOT DETECTED.  The SARS-CoV-2 RNA is generally detectable in upper respiratory specimens during the acute phase of infection. The lowest concentration of SARS-CoV-2 viral copies this assay can detect is 138 copies/mL. A negative result does not preclude  SARS-Cov-2 infection and should not be used as the sole basis for treatment or other patient management decisions. A negative result may occur with  improper specimen collection/handling, submission of specimen other than nasopharyngeal swab, presence of viral mutation(s) within the areas targeted by this assay, and inadequate number of viral copies(<138 copies/mL). A negative result must be combined with clinical observations, patient history, and epidemiological information. The expected result is Negative.  Fact Sheet for Patients:  BloggerCourse.com  Fact Sheet for Healthcare Providers:  SeriousBroker.it  This test is no t yet approved or cleared by the [place] FDA and  has been authorized for detection and/or diagnosis of SARS-CoV-2 by FDA under an Emergency Use Authorization (EUA). This EUA will remain  in effect (meaning this test can be used) for the duration of the COVID-19 declaration under Section 564(b)(1) of the Act, 21 U.S.C.section 360bbb-3(b)(1), unless the authorization is terminated  or revoked sooner.       Influenza A by PCR NEGATIVE NEGATIVE Final   Influenza B by PCR NEGATIVE NEGATIVE Final    Comment: (NOTE) The Xpert Xpress SARS-CoV-2/FLU/RSV plus assay is intended as an aid in the diagnosis of influenza from Nasopharyngeal swab specimens and should not be used as a sole basis for treatment. Nasal washings and aspirates are unacceptable for Xpert Xpress SARS-CoV-2/FLU/RSV testing.  Fact Sheet for Patients: BloggerCourse.com  Fact Sheet for Healthcare Providers: SeriousBroker.it  This test is not yet approved or cleared by the [place] FDA and has been authorized for detection and/or diagnosis of SARS-CoV-2 by FDA under an Emergency Use Authorization (EUA). This EUA will remain in effect (meaning this test can be used) for the duration of  the COVID-19 declaration under Section 564(b)(1) of the Act, 21 U.S.C. section 360bbb-3(b)(1), unless the authorization is terminated or revoked.  Performed at [facility], [address], [place], [place] [address]      Labs: BNP (last 3 results) No results for input(s): BNP in the last 8760 hours. Basic Metabolic Panel: Recent Labs  Lab 04/22/21 1310 04/23/21 0349 04/25/21 0944 04/26/21 0040 04/27/21 0320  NA 137 137 139 134* 138  K 3.9 3.9 4.0 3.3* 3.7  CL 103 106 108 106 107  CO2 23 25 25 22 22   GLUCOSE 84 91 92 185* 96  BUN 8 14 <5* 6 8  CREATININE 0.73 0.52 0.51 0.49 0.50  CALCIUM 9.5 8.6* 8.7* 8.2* 8.6*  MG  --  2.0  --  1.9 2.2  PHOS  --  4.6  --   --   --    Liver Function Tests: Recent Labs  Lab 04/22/21 1310  AST 22  ALT 17  ALKPHOS 62  BILITOT 0.6  PROT  7.6  ALBUMIN 4.0   No results for input(s): LIPASE, AMYLASE in the last 168 hours. No results for input(s): AMMONIA in the last 168 hours. CBC: Recent Labs  Lab 04/22/21 1310 04/25/21 0944 04/26/21 0040 04/27/21 0320  WBC 11.3* 6.9 9.1 8.7  HGB 12.8 11.7* 10.5* 11.2*  HCT 39.3 35.7* 32.5* 34.0*  MCV 94.9 93.7 94.5 93.2  PLT 386 277 265 265   Cardiac Enzymes: Recent Labs  Lab 04/23/21 0349  CKTOTAL 22*   BNP: Invalid input(s): POCBNP CBG: No results for input(s): GLUCAP in the last 168 hours. D-Dimer No results for input(s): DDIMER in the last 72 hours. Hgb A1c No results for input(s): HGBA1C in the last 72 hours. Lipid Profile No results for input(s): CHOL, HDL, LDLCALC, TRIG, CHOLHDL, LDLDIRECT in the last 72 hours. Thyroid function studies No results for input(s): TSH, T4TOTAL, T3FREE, THYROIDAB in the last 72 hours.  Invalid input(s): FREET3 Anemia work up No results for input(s): VITAMINB12, FOLATE, FERRITIN, TIBC, IRON, RETICCTPCT in the last 72 hours. Urinalysis    Component Value Date/Time   COLORURINE STRAW (A) 03/17/2021 1606   APPEARANCEUR CLEAR 03/17/2021  1606   LABSPEC 1.011 03/17/2021 1606   PHURINE 7.0 03/17/2021 1606   GLUCOSEU NEGATIVE 03/17/2021 1606   HGBUR SMALL (A) 03/17/2021 1606   BILIRUBINUR NEGATIVE 03/17/2021 1606   KETONESUR NEGATIVE 03/17/2021 1606   PROTEINUR NEGATIVE 03/17/2021 1606   NITRITE NEGATIVE 03/17/2021 1606   LEUKOCYTESUR NEGATIVE 03/17/2021 1606   Sepsis Labs Invalid input(s): PROCALCITONIN,  WBC,  LACTICIDVEN Microbiology Recent Results (from the past 240 hour(s))  Resp Panel by RT-PCR (Flu A&B, Covid) Nasopharyngeal Swab     Status: None   Collection Time: 04/22/21  8:02 PM   Specimen: Nasopharyngeal Swab; Nasopharyngeal(NP) swabs in vial transport medium  Result Value Ref Range Status   SARS Coronavirus 2 by RT PCR NEGATIVE NEGATIVE Final    Comment: (NOTE) SARS-CoV-2 target nucleic acids are NOT DETECTED.  The SARS-CoV-2 RNA is generally detectable in upper respiratory specimens during the acute phase of infection. The lowest concentration of SARS-CoV-2 viral copies this assay can detect is 138 copies/mL. A negative result does not preclude SARS-Cov-2 infection and should not be used as the sole basis for treatment or other patient management decisions. A negative result may occur with  improper specimen collection/handling, submission of specimen other than nasopharyngeal swab, presence of viral mutation(s) within the areas targeted by this assay, and inadequate number of viral copies(<138 copies/mL). A negative result must be combined with clinical observations, patient history, and epidemiological information. The expected result is Negative.  Fact Sheet for Patients:  BloggerCourse.com  Fact Sheet for Healthcare Providers:  SeriousBroker.it  This test is no t yet approved or cleared by the [place] FDA and  has been authorized for detection and/or diagnosis of SARS-CoV-2 by FDA under an Emergency Use Authorization (EUA). This EUA will  remain  in effect (meaning this test can be used) for the duration of the COVID-19 declaration under Section 564(b)(1) of the Act, 21 U.S.C.section 360bbb-3(b)(1), unless the authorization is terminated  or revoked sooner.       Influenza A by PCR NEGATIVE NEGATIVE Final   Influenza B by PCR NEGATIVE NEGATIVE Final    Comment: (NOTE) The Xpert Xpress SARS-CoV-2/FLU/RSV plus assay is intended as an aid in the diagnosis of influenza from Nasopharyngeal swab specimens and should not be used as a sole basis for treatment. Nasal washings and aspirates are unacceptable for Xpert  Xpress SARS-CoV-2/FLU/RSV testing.  Fact Sheet for Patients: BloggerCourse.com  Fact Sheet for Healthcare Providers: SeriousBroker.it  This test is not yet approved or cleared by the [place] FDA and has been authorized for detection and/or diagnosis of SARS-CoV-2 by FDA under an Emergency Use Authorization (EUA). This EUA will remain in effect (meaning this test can be used) for the duration of the COVID-19 declaration under Section 564(b)(1) of the Act, 21 U.S.C. section 360bbb-3(b)(1), unless the authorization is terminated or revoked.  Performed at [facility], [address] [place], [place] [address]      Time coordinating discharge: Over 30 minutes  SIGNED:   Ollen Bowl, MD  [facility] 04/27/2021, 11:26 AM Pager   If 7PM-7AM, please contact night-coverage [email or web address]

## 2021-04-27 NOTE — Progress Notes (Signed)
Neurology has been paged. 

## 2021-04-27 NOTE — Progress Notes (Addendum)
Pt has refused MRI. Stated she would like to speak to neurologist first because she feels another MRI is unnecessary. Attending MD made aware.

## 2021-04-27 NOTE — Progress Notes (Addendum)
Subjective: No further muscle spasms.  Does occasionally feel something in the some muscle tightness which improves when patient walks.   ROS: negative except above  Examination  Vital signs in last 24 hours: Temp:  [97.7 F (36.5 C)-98.3 F (36.8 C)] 98.1 F (36.7 C) (12/26 0724) Pulse Rate:  [72-98] 76 (12/26 0724) Resp:  [17-20] 17 (12/26 0724) BP: (94-109)/(63-71) 105/69 (12/26 0724) SpO2:  [95 %-99 %] 97 % (12/26 0724)  General: lying in bed, NAD CVS: pulse-normal rate and rhythm RS: breathing comfortably, CTAB Extremities: warm, no edema   Neuro: MS: Alert, oriented, follows commands CN: pupils equal and reactive,  EOMI, face symmetric, tongue midline, normal sensation over face, Motor: 5/5 strength in all 4 extremities  Basic Metabolic Panel: Recent Labs  Lab 04/22/21 1310 04/23/21 0349 04/25/21 0944 04/26/21 0040 04/27/21 0320  NA 137 137 139 134* 138  K 3.9 3.9 4.0 3.3* 3.7  CL 103 106 108 106 107  CO2 23 25 25 22 22   GLUCOSE 84 91 92 185* 96  BUN 8 14 <5* 6 8  CREATININE 0.73 0.52 0.51 0.49 0.50  CALCIUM 9.5 8.6* 8.7* 8.2* 8.6*  MG  --  2.0  --  1.9 2.2  PHOS  --  4.6  --   --   --     CBC: Recent Labs  Lab 04/22/21 1310 04/25/21 0944 04/26/21 0040 04/27/21 0320  WBC 11.3* 6.9 9.1 8.7  HGB 12.8 11.7* 10.5* 11.2*  HCT 39.3 35.7* 32.5* 34.0*  MCV 94.9 93.7 94.5 93.2  PLT 386 277 265 265     Coagulation Studies: No results for input(s): LABPROT, INR in the last 72 hours.  Imaging No new brain imaging overnight   ASSESSMENT AND PLAN:  40 year old female with multiple sclerosis presented with episodes of right face twitching, upper extremity and lower extremity stiffening.   Transient right-sided stiffening Relapsing remitting multiple sclerosis -EEG did not show any ictal-interictal activity.  However focal motor seizures may not be seen on scalp EEG. -Differential for parent episodes includes muscle spasms versus focal motor seizures    Recommendations -Continue oxcarbazepine 150 mg twice daily -If episodes recur, can consider increasing oxcarbazepine -Recommend follow-up with neurology in 12 weeks.   -Discussed side effects of oxcarbazepine including dizziness and hyponatremia with patient.   -Also recommend p.o. Ativan 2 mg as needed for anxiety.  Discussed potential side effect of abuse and dependence -Recommend checking BMP in 2weeks - seizure precautions including do not drive until cleared by neuro -Management of rest of comorbidities per primary team   I have spent a total of 37 minutes with the patient reviewing hospital notes,  test results, labs and examining the patient as well as establishing an assessment and plan that was discussed personally with the patient.  > 50% of time was spent in direct patient care.   46 Epilepsy [facility] For questions after 5pm please refer to AMION to reach the Neurologist on call

## 2021-04-28 ENCOUNTER — Encounter: Payer: Self-pay | Admitting: Neurology

## 2021-04-28 ENCOUNTER — Other Ambulatory Visit: Payer: Self-pay

## 2021-04-28 ENCOUNTER — Ambulatory Visit (INDEPENDENT_AMBULATORY_CARE_PROVIDER_SITE_OTHER): Payer: 59 | Admitting: Neurology

## 2021-04-28 VITALS — BP 112/64 | HR 96 | Ht 62.0 in | Wt 127.0 lb

## 2021-04-28 DIAGNOSIS — R252 Cramp and spasm: Secondary | ICD-10-CM | POA: Diagnosis not present

## 2021-04-28 DIAGNOSIS — G35 Multiple sclerosis: Secondary | ICD-10-CM | POA: Diagnosis not present

## 2021-04-28 DIAGNOSIS — Z79899 Other long term (current) drug therapy: Secondary | ICD-10-CM

## 2021-04-28 MED ORDER — DIAZEPAM 5 MG PO TABS: 5.0000 mg | ORAL_TABLET | Freq: Three times a day (TID) | ORAL | 1 refills | Status: DC | PRN

## 2021-04-28 MED ORDER — DIMETHYL FUMARATE 240 MG PO CPDR: 240.0000 mg | DELAYED_RELEASE_CAPSULE | Freq: Two times a day (BID) | ORAL | 3 refills | Status: DC
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
## 2021-04-28 NOTE — Telephone Encounter (Signed)
Update from Dr. Epimenio Foot on 04/28/21 after seeing pt for visit today: pt decided to start on dimethyl fumarate. Decided against Vumerity. I emailed [employer] to provide update.

## 2021-04-29 ENCOUNTER — Other Ambulatory Visit: Payer: Self-pay | Admitting: *Deleted

## 2021-04-29 DIAGNOSIS — R200 Anesthesia of skin: Secondary | ICD-10-CM

## 2021-04-29 DIAGNOSIS — R252 Cramp and spasm: Secondary | ICD-10-CM

## 2021-04-29 DIAGNOSIS — R29898 Other symptoms and signs involving the musculoskeletal system: Secondary | ICD-10-CM

## 2021-04-29 DIAGNOSIS — G35 Multiple sclerosis: Secondary | ICD-10-CM

## 2021-04-30 ENCOUNTER — Telehealth: Payer: Self-pay | Admitting: Neurology

## 2021-04-30 NOTE — Telephone Encounter (Signed)
Pt would like a call from the nurse to discuss previously sent MyChart message.

## 2021-04-30 NOTE — Telephone Encounter (Signed)
Dr. Epimenio Foot replied to pt mychart message.

## 2021-04-30 NOTE — Telephone Encounter (Signed)
Waiting to speak with MD about mychart message. Will call pt once able to speak with MD

## 2021-05-03 ENCOUNTER — Telehealth: Payer: Self-pay | Admitting: Neurology

## 2021-05-03 MED ORDER — LEVETIRACETAM 500 MG PO TABS: 500.0000 mg | ORAL_TABLET | Freq: Two times a day (BID) | ORAL | 5 refills | Status: DC

## 2021-05-03 NOTE — Telephone Encounter (Signed)
Spasms have returned --- most likely phasic spasms relate to her MS.   We will d/c the oxcarbazepine and start levetiracetam 500 mg po bid  She also noted swelling in feet - hopefully will resolve with med change.   If swelling worsens check electrolytes in a few days.

## 2021-05-06 ENCOUNTER — Other Ambulatory Visit: Payer: Self-pay | Admitting: Neurology

## 2021-05-06 ENCOUNTER — Other Ambulatory Visit: Payer: Self-pay | Admitting: *Deleted

## 2021-05-06 MED ORDER — BACLOFEN 10 MG PO TABS: ORAL_TABLET | ORAL | 3 refills | Status: DC

## 2021-05-06 NOTE — Progress Notes (Signed)
baclo

## 2021-05-08 ENCOUNTER — Ambulatory Visit: Payer: Commercial Managed Care - HMO | Admitting: Physical Therapy

## 2021-05-11 ENCOUNTER — Telehealth: Payer: Self-pay | Admitting: Neurology

## 2021-05-11 ENCOUNTER — Other Ambulatory Visit: Payer: Self-pay | Admitting: Neurology

## 2021-05-11 ENCOUNTER — Encounter: Payer: Self-pay | Admitting: Neurology

## 2021-05-11 MED ORDER — OXCARBAZEPINE 150 MG PO TABS: ORAL_TABLET | ORAL | 5 refills | Status: DC

## 2021-05-11 MED ORDER — LEVETIRACETAM 500 MG PO TABS: 500.0000 mg | ORAL_TABLET | Freq: Four times a day (QID) | ORAL | 5 refills | Status: DC

## 2021-05-11 NOTE — Telephone Encounter (Signed)
She reports that the spasms are occurring more frequently, now about 8-10 times a day.  The usually occur after she moves, especially when she wakes up first thing in the morning to get out of bed to use the bathroom.  Initially, she had been placed on oxcarbazepine because seizures were suspected.  That actually seemed to help her within a couple days of starting but then she had some breakthrough spells so we had started levetiracetam.  The dose was increased to 1500 mg a day and spells persisted, baclofen was added without any benefit..  She is also taking benzodiazepines 2 to 3 pills a day.   We went over some options.  Because she initially seemed to get a benefit from oxcarbazepine and her dose was low, I recommend that we restart the oxcarbazepine and I will increase the dose to 150 mg in the morning and 300 mg at night.  If she gets no benefit after a week, consider topiramate.

## 2021-05-11 NOTE — Telephone Encounter (Signed)
Pt's sister called states the levETIRAcetam (KEPPRA) 500 MG tablet is showing no improvement, still having the spasms and the same frequency. Pt sister requesting a call back regarding what should be done.

## 2021-05-11 NOTE — Telephone Encounter (Signed)
Called the sister back. Advised the sister that I spoke with Dr Felecia Shelling prior to calling and informed him of what she stated. He would like for her to increase the keppra to QID.  When making the recommendation the sister questioned if it is worth continuing the keppra if she continues to not note improvement. She went on to say all the medications that she is on and that the patient is still continuing to have spasms/pain. She mentioned a new problem where with her left foot became unable to move it and decreased sensation... she slowly noted the sensation start to return and then had pain in left knee. Normally this would happen on the right side. I advised that Dr Felecia Shelling did not recommend a change in medication but just increasing the keppra at this time.  The sister asked if Dr Felecia Shelling could contact the patient. I advised I would be happy to look and see about getting her scheduled to come in for a follow up visit to discuss the concerns. The sister replied, or have him call her.  I advised the sister that we really need to set boundaries with the Doctor calling. I advised that is what we are here for is to help relay the concern to the doctor and take back the doctor's recommendation to the patient.  I told her I would pass this request along but could not promise a call would happen today.  Sister was appreciative and asked that he contact the patient rather than her.  I told her in the meantime the pt should start his recommendation to increase the keppra to QID. She verbalized understanding and states she will call the patient right away

## 2021-05-12 ENCOUNTER — Other Ambulatory Visit: Payer: Self-pay | Admitting: *Deleted

## 2021-05-12 MED ORDER — OXCARBAZEPINE 150 MG PO TABS: ORAL_TABLET | ORAL | 5 refills | Status: DC

## 2021-05-19 ENCOUNTER — Other Ambulatory Visit: Payer: Self-pay | Admitting: *Deleted

## 2021-05-19 MED ORDER — OXCARBAZEPINE 150 MG PO TABS: ORAL_TABLET | ORAL | 5 refills | Status: DC

## 2021-05-21 ENCOUNTER — Encounter: Payer: Self-pay | Admitting: Occupational Therapy

## 2021-05-21 NOTE — Therapy (Signed)
[facility] [address] [place], [place], [address] Phone: [phone]   Fax:  [phone]  Patient Details  Name: Janice Rose MRN: [id number] Date of Birth: March 21, 1981 Referring Provider:  No ref. provider found  Encounter Date: 05/21/2021  OCCUPATIONAL THERAPY DISCHARGE SUMMARY  Visits from Start of Care: 1 (eval)  Current functional level related to goals / functional outcomes: See eval as pt did not return after eval.  Pt was instructed in coordination and putty HEP.     Remaining deficits: See eval as pt did not return after eval   Education / Equipment: Pt instructed in coordination and putty HEP.   Plan: Patient agrees to discharge.  Patient goals were not met. Patient is being discharged due to not returning since evaluation.  Per plan, pt to cancel remaining visit/visits if she did not feel like she needed them due to high copay and improvements noted at time of eval.  Pt did cancel remaining visits due to being pleased with functional status and will be discharged at this time.       [facility], [place] 05/21/2021, 7:39 AM  [facility] [address] [place], [place], [address] Phone: [phone]   Fax:  [place], OTR/L [facility] [address] [place], [place]  [address] [phone] phone [phone] 05/21/21 7:39 AM

## 2021-06-10 ENCOUNTER — Other Ambulatory Visit: Payer: Self-pay

## 2021-06-10 ENCOUNTER — Ambulatory Visit: Payer: Commercial Managed Care - HMO | Attending: Neurology | Admitting: Physical Therapy

## 2021-06-10 DIAGNOSIS — R29898 Other symptoms and signs involving the musculoskeletal system: Secondary | ICD-10-CM | POA: Diagnosis not present

## 2021-06-10 DIAGNOSIS — R252 Cramp and spasm: Secondary | ICD-10-CM | POA: Insufficient documentation

## 2021-06-10 DIAGNOSIS — R200 Anesthesia of skin: Secondary | ICD-10-CM | POA: Diagnosis not present

## 2021-06-10 DIAGNOSIS — M6281 Muscle weakness (generalized): Secondary | ICD-10-CM | POA: Diagnosis not present

## 2021-06-10 DIAGNOSIS — G35 Multiple sclerosis: Secondary | ICD-10-CM | POA: Insufficient documentation

## 2021-06-10 DIAGNOSIS — R2689 Other abnormalities of gait and mobility: Secondary | ICD-10-CM | POA: Diagnosis present

## 2021-06-10 NOTE — Therapy (Signed)
[facility] [address] [place], [place], [address] Phone: [phone]   Fax:  [phone]  Physical Therapy Evaluation  Patient Details  Name: Janice Rose MRN: [id number] Date of Birth: Mar 08, 1981 Referring Provider (PT): Despina Arias   Encounter Date: 06/10/2021   PT End of Session - 06/10/21 0846     Visit Number 1    Number of Visits 7    Date for PT Re-Evaluation 07/22/21    Authorization Type [employer]    PT Start Time 0848    PT Stop Time 0932    PT Time Calculation (min) 44 min    Activity Tolerance Patient tolerated treatment well    Behavior During Therapy [facility] for tasks assessed/performed             Past Medical History:  Diagnosis Date   MS (multiple sclerosis) (HCC)     Past Surgical History:  Procedure Laterality Date   none      There were no vitals filed for this visit.    Subjective Assessment - 06/10/21 0850     Subjective Started getting spasm attacks in R side, in December. R leg would push out and R hand would fist.  Any movements I made would cause the spasms.  After testing at hospital,  conclusion was tonic spasms due to MS.  Tried medication changes, but the medications made the spasms worse.  Feels like the R side is weaker after the spasms; have to think about walking more.  Have generally been resting, not moving so my body feels very weak.    Pertinent History PMH: MS (diagnosed in 2005)    Patient Stated Goals To strengthen R side after the spasms.    Currently in Pain? No/denies                [facility] PT Assessment - 06/10/21 0855       Assessment   Medical Diagnosis MS, spasticity    Referring Provider (PT) Epimenio Foot, Richard    Onset Date/Surgical Date 04/29/21    Hand Dominance Right    Prior Therapy eval only 03/2021 (has had exacerbation of spasms since then)      Precautions   Precautions None      Balance Screen   Has the patient fallen in the past 6 months No     Has the patient had a decrease in activity level because of a fear of falling?  No    Is the patient reluctant to leave their home because of a fear of falling?  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   Leisure spending time with kids      Observation/Other Assessments   Focus on Therapeutic Outcomes (FOTO)  NA      Sensation   Light Touch Appears Intact    Additional Comments reports "pulling" sensation in veins-more in RUE      Coordination   Gross Motor Movements are Fluid and Coordinated Yes      Functional Tests   Functional tests Other;[place]  Other:   Other/ Comments Tandem stance:  30 sec each foot position, increased trunk sway RLE posterior      Other:   Other/Comments SLS:  10 sec each leg, increased sway on RLE      Tone   Assessment Location Right Lower Extremity;Left Lower Extremity      ROM / Strength   AROM / PROM / Strength AROM;Strength      AROM   Overall AROM  Within functional limits for tasks performed      Strength   Overall Strength Deficits    Right/Left Hip Right;Left    Right Hip Flexion 4+/5    Left Hip Flexion 5/5    Right/Left Knee Right;Left    Right Knee Flexion 5/5    Right Knee Extension 5/5    Left Knee Flexion 5/5    Left Knee Extension 5/5    Right/Left Ankle Right;Left    Right Ankle Dorsiflexion 5/5    Right Ankle Inversion 4/5    Right Ankle Eversion 4/5    Left Ankle Dorsiflexion 5/5      Transfers   Transfers Sit to Stand;Stand to Sit    Sit to Stand 7: Independent    Five time sit to stand comments  14.63    Stand to Sit 7: Independent    Comments Reports fatigue after 5x sit to stand       Ambulation/Gait   Ambulation/Gait Yes    Ambulation/Gait Assistance 7: Independent    Ambulation Distance (Feet) 60 Feet   x 2   Assistive device None    Gait Pattern Within Functional Limits    Ambulation Surface Level;Indoor    Gait velocity 12.59 sec=2.61 ft/sec      Standardized Balance Assessment   Standardized Balance Assessment Dynamic Gait Index      Dynamic Gait Index   Level Surface Mild Impairment    Change in Gait Speed Mild Impairment    Gait with Horizontal Head Turns Mild Impairment    Gait with Vertical Head Turns Mild Impairment    Gait and Pivot Turn Normal    Step Over Obstacle Mild Impairment    Step Around Obstacles Mild Impairment    Steps Normal    Total Score 18    DGI comment: Scores <19/24 indicate increased fall risk      RLE Tone   RLE Tone Mild                        Objective measurements completed on examination: See above findings.             Exercises Sit to stand in stride stance - 1 x daily - 5 x weekly - 1-2 sets - 5 reps Mini Squat - 1 x daily - 5 x weekly - 1-2 sets - 5 reps Side to side weightshift - 1 x daily - 5 x weekly - 1-2 sets - 5 reps   PT Education - 06/10/21 1633     Education Details PT eval results and POC; initiated HEP    Person(s) Educated Patient    Methods Explanation;Demonstration;Handout    Comprehension Verbalized understanding;Returned demonstration              PT Short Term Goals - 06/10/21 1647       PT SHORT TERM GOAL #1   Title Pt will be independent with HEP for improved strength, balance, transfers, and gait.  TARGET 06/26/2021    Time  2    Period Weeks    Status New      PT SHORT TERM GOAL #2   Baseline --    Time --    Period --    Status --      PT SHORT TERM GOAL #3   Title --    Baseline --    Time --    Period --    Status --      PT SHORT TERM GOAL #4   Title --    Time --    Period --    Status --               PT Long Term Goals -  06/10/21 1649       PT LONG TERM GOAL #1   Title Pt will verbalize plans for continued community fitness upon d/c from PT.  TARGET 07/10/2021    Time 5    Period Weeks    Status New      PT LONG TERM GOAL #2   Title Pt will improve 5x sit<>stand to less than or equal to 11.5 sec to demonstrate improved functional strength and transfer efficiency.    Baseline 14.63 sec    Time 5    Period Weeks    Status New      PT LONG TERM GOAL #3   Title Pt will improve DGI score to at least 20/24 to decrease fall risk    Baseline 18/24    Time 5    Period Weeks    Status New                    Plan - 06/10/21 1637     Clinical Impression Statement Pt is a 41 yo female referred to Neuro OPPT following at least one month of RUE and RLE spasms, following an exacerbation of MS in November 2022.  She reports multiple episodes daily of spasms and with overall decreased activity level due to spasms.  She does present with decreased RLE strength, decreased functional lower extremity strength, decreased balance, increased RLE muscle tone; strength and balance measures decreased since PT eval 11/22).  Pt is active with family, children, and household tasks.  She would benefit from skilled PT to address the above stated deficits to improve overall strength and functional mobility.    Personal Factors and Comorbidities Other   MS exacerbation 11/22, then muscle spasms going on at least 1 month   Examination-Activity Limitations Locomotion Level;Stand;Caring for Others    Examination-Participation Restrictions Community Activity;Meal Prep;Other   Childcare tasks   Stability/Clinical Decision Making Evolving/Moderate complexity    Clinical Decision Making Moderate    Rehab Potential Good    PT Frequency Other (comment)   2x/wk for 2 weeks, then 1x/wk for 2 weeks   PT Duration Other (comment)   plus eval visit   PT Treatment/Interventions ADLs/Self Care Home Management;Gait training;Functional  mobility training;Therapeutic activities;Therapeutic exercise;Balance training;Neuromuscular re-education;Manual techniques;Patient/family education;Passive range of motion    PT Next Visit Plan Review initial HEP; RLE stretching and weightbearing strengthening-SLS, tandem stance, hip stability; progress HEP    Consulted and Agree with Plan of Care Patient             Patient will benefit from skilled therapeutic intervention in order to improve the following deficits and impairments:  Abnormal gait, Difficulty walking, Increased muscle spasms, Impaired tone, Decreased balance, Decreased mobility, Decreased strength  Visit Diagnosis: Muscle weakness (generalized)  Other abnormalities  of gait and mobility     Problem List Patient Active Problem List   Diagnosis Date Noted   Spasticity 04/28/2021   Muscle spasm 04/22/2021   High risk medication use 03/25/2021   Right hand weakness 03/25/2021   Numbness 03/25/2021   Carbuncle 03/21/2021   Normocytic anemia 03/21/2021   Otalgia, left 03/21/2021   Multiple sclerosis (HCC) 03/18/2021    Bowen Kia W., PT 06/10/2021, 4:53 PM  [facility] [address]  [place], [place], [address] Phone: [phone]   Fax:  [phone]  Name: Janice Rose MRN: [id number] Date of Birth: 1980/05/10

## 2021-06-10 NOTE — Patient Instructions (Signed)
Access Code: [email or web address] URL: [email or web address] Date: 06/10/2021 Prepared by: [facility]  Exercises Sit to stand in stride stance - 1 x daily - 5 x weekly - 1-2 sets - 5 reps Mini Squat - 1 x daily - 5 x weekly - 1-2 sets - 5 reps Side to side weightshift - 1 x daily - 5 x weekly - 1-2 sets - 5 reps

## 2021-06-16 ENCOUNTER — Other Ambulatory Visit: Payer: Self-pay | Admitting: Neurology

## 2021-06-16 DIAGNOSIS — G35 Multiple sclerosis: Secondary | ICD-10-CM

## 2021-06-16 DIAGNOSIS — R748 Abnormal levels of other serum enzymes: Secondary | ICD-10-CM

## 2021-06-16 DIAGNOSIS — Z79899 Other long term (current) drug therapy: Secondary | ICD-10-CM

## 2021-06-17 ENCOUNTER — Encounter: Payer: Self-pay | Admitting: Physical Therapy

## 2021-06-17 ENCOUNTER — Other Ambulatory Visit (HOSPITAL_BASED_OUTPATIENT_CLINIC_OR_DEPARTMENT_OTHER): Payer: Self-pay | Admitting: Internal Medicine

## 2021-06-17 ENCOUNTER — Other Ambulatory Visit: Payer: Self-pay

## 2021-06-17 ENCOUNTER — Ambulatory Visit: Payer: Commercial Managed Care - HMO | Admitting: Physical Therapy

## 2021-06-17 DIAGNOSIS — Z1231 Encounter for screening mammogram for malignant neoplasm of breast: Secondary | ICD-10-CM

## 2021-06-17 DIAGNOSIS — M6281 Muscle weakness (generalized): Secondary | ICD-10-CM

## 2021-06-17 NOTE — Patient Instructions (Signed)
Access Code: [email or web address] URL: [email or web address] Date: 06/17/2021 Prepared by: [facility]  Exercises Sit to stand in stride stance - 1 x daily - 5 x weekly - 1-2 sets - 5 reps Mini Squat - 1 x daily - 5 x weekly - 1-2 sets - 5 reps Side to side weightshift - 1 x daily - 5 x weekly - 1-2 sets - 5 reps Seated Single Arm Bicep Curls with Rotation and Dumbbell - 1 x daily - 3 x weekly - 1-2 sets - 5 reps Seated Triceps Extension with Dumbbells - 1 x daily - 3 x weekly - 1-2 sets - 5 reps Supine Scapular Retraction - 1 x daily - 5 x weekly - 1 sets - 10 reps Hooklying Single Knee to Chest - 1 x daily - 5 x weekly - 1 sets - 3 reps - 10 sec hold Sitting Knee Extension with Resistance - 1 x daily - 3 x weekly - 1-2 sets - 10 reps Seated Hamstring Curls with Resistance - 1 x daily - 5 x weekly - 1-2 sets - 10 reps

## 2021-06-17 NOTE — Therapy (Addendum)
[facility] [address] [place], [place], [address] Phone: [phone]   Fax:  [phone]  Physical Therapy Treatment  Patient Details  Name: Janice Rose MRN: [id number] Date of Birth: Oct 01, 1980 Referring Provider (PT): Arlice Colt   Encounter Date: 06/17/2021   PT End of Session - 06/17/21 1424     Visit Number 2    Number of Visits 7    Date for PT Re-Evaluation 07/22/21    Authorization Type [employer]    PT Start Time 0848    PT Stop Time 0933    PT Time Calculation (min) 45 min    Activity Tolerance Patient tolerated treatment well   RUE and RLE fatigue with repetitive, sustained activity   Behavior During Therapy [facility] for tasks assessed/performed             Past Medical History:  Diagnosis Date   MS (multiple sclerosis) ([place])     Past Surgical History:  Procedure Laterality Date   none      There were no vitals filed for this visit.   Subjective Assessment - 06/17/21 0850     Subjective Have not had any spasms this week.  Been walking for about 30 minutes and doing the exercises.  Want to work on the right arm as well.    Pertinent History PMH: MS (diagnosed in 2005)    Patient Stated Goals To strengthen R side after the spasms.    Currently in Pain? No/denies                [facility] PT Assessment - 06/17/21 0001       Strength   Overall Strength Deficits    Overall Strength Comments tremulous with sustained MMT position    Strength Assessment Site Shoulder;Elbow;Wrist;Hand;Other (comment)   Grip:  RUE 20#, then 25;  LUE 25# x 2 reps   Right/Left Shoulder Right;Left    Right Shoulder Flexion 4/5    Right Shoulder ABduction 4/5    Right/Left Elbow Right;Left    Right Elbow Flexion 4/5    Right Elbow Extension 4/5    Right/Left Wrist Right;Left    Right Wrist Flexion 4/5    Right Wrist Extension 4/5              Exercises-Reviewed initial HEP from last visit.  Pt return demo  understanding  Sit to stand in stride stance - 1 x daily - 5 x weekly - 1-2 sets - 5 reps Mini Squat - 1 x daily - 5 x weekly - 1-2 sets - 5 reps Side to side weightshift - 1 x daily - 5 x weekly - 1-2 sets - 5 reps             [facility] Adult PT Treatment/Exercise - 06/17/21 0001       Exercises   Exercises Lumbar;Knee/Hip;Other Exercises    Other Exercises  Seated RUE weightbearing, 3 reps, 10 sec; then side sit weightbearing on forearms to R side, 5 reps, then sit up at midline with extended arm.  Seated A/ROM repeated elbow extension (triceps) x 5 reps; biceps curl, A/ROM x 5 reps.  Seated shoulder rolls back, 2 x 5 reps      Lumbar Exercises: Stretches   Single Knee to Chest Stretch Right;Left;3 reps;20 seconds    Single Knee to Chest Stretch Limitations Initiated with towel to assist, but fatigued noted with trembling RUE, so then used hands locked at thigh to pull  knee to chest.    Lower Trunk Rotation 3 reps;20 seconds    Other Lumbar Stretch Exercise scapular squeezes in supine, arms by side and palms down, 5 reps      Knee/Hip Exercises: Standing   Hip Abduction Right;Left;Stengthening;1 set;5 reps;Knee straight    Abduction Limitations red theraband    Hip Extension Right;Left;Stengthening;1 set;5 reps;Knee straight;Limitations    Extension Limitations Red theraband    Other Standing Knee Exercises forward march along counter, 2 reps, then forward tandem gait x 10 ft with UE support      Knee/Hip Exercises: Seated   Long Arc Quad Strengthening;Right;Left;1 set;5 reps;Limitations    Long Arc Quad Limitations red theraband    Hamstring Curl Strengthening;Right;Left;1 set;5 reps;Limitations    Hamstring Limitations red theraband                     PT Education - 06/17/21 1423     Education Details Additions to HEP-see instructions.  Discussed alternating days for UE and lower extremity strengthening exercises.    Person(s) Educated Patient    Methods  Explanation;Demonstration;Handout    Comprehension Verbalized understanding;Returned demonstration              PT Short Term Goals - 06/10/21 1647       PT SHORT TERM GOAL #1   Title Pt will be independent with HEP for improved strength, balance, transfers, and gait.  TARGET 06/26/2021    Time 2    Period Weeks    Status New      PT SHORT TERM GOAL #2   Baseline --    Time --    Period --    Status --      PT SHORT TERM GOAL #3   Title --    Baseline --    Time --    Period --    Status --      PT SHORT TERM GOAL #4   Title --    Time --    Period --    Status --               PT Long Term Goals - 06/10/21 1649       PT LONG TERM GOAL #1   Title Pt will verbalize plans for continued community fitness upon d/c from PT.  TARGET 07/10/2021    Time 5    Period Weeks    Status New      PT LONG TERM GOAL #2   Title Pt will improve 5x sit<>stand to less than or equal to 11.5 sec to demonstrate improved functional strength and transfer efficiency.    Baseline 14.63 sec    Time 5    Period Weeks    Status New      PT LONG TERM GOAL #3   Title Pt will improve DGI score to at least 20/24 to decrease fall risk    Baseline 18/24    Time 5    Period Weeks    Status New                   Plan - 06/17/21 [id number]     Clinical Impression Statement Reviewed HEP and added additional exercises for stretch/flexibility and RUE, RLE strengthening.  With RUE c/o weakness, pt demonstrates 4/5 strength throughout RUE, but she has tremulous activity in biceps/triceps with fatigue.  She is motivated to perform HEP and PT cautioned pt to avoid too many exercises at once;  she verbalizes plans to divide HEP into UE and lower extremity days.  She will continue to benefit from skilled PT towards goals for improved strength, flexibility.    Personal Factors and Comorbidities Other   MS exacerbation 11/22, then muscle spasms going on at least 1 month   Examination-Activity  Limitations Locomotion Level;Stand;Caring for Others    Examination-Participation Restrictions Community Activity;Meal Prep;Other   Childcare tasks   Stability/Clinical Decision Making Evolving/Moderate complexity    Rehab Potential Good    PT Frequency Other (comment)   2x/wk for 2 weeks, then 1x/wk for 2 weeks   PT Duration Other (comment)   plus eval visit   PT Treatment/Interventions ADLs/Self Care Home Management;Gait training;Functional mobility training;Therapeutic activities;Therapeutic exercise;Balance training;Neuromuscular re-education;Manual techniques;Patient/family education;Passive range of motion    PT Next Visit Plan Review HEP; RUE and RLE stretching and weightbearing strengthening-SLS, tandem stance, hip stability-possibility wall or counter push ups, step ups, TKE exercises    Consulted and Agree with Plan of Care Patient             Patient will benefit from skilled therapeutic intervention in order to improve the following deficits and impairments:  Abnormal gait, Difficulty walking, Increased muscle spasms, Impaired tone, Decreased balance, Decreased mobility, Decreased strength  Visit Diagnosis: Muscle weakness (generalized)     Problem List Patient Active Problem List   Diagnosis Date Noted   Spasticity 04/28/2021   Muscle spasm 04/22/2021   High risk medication use 03/25/2021   Right hand weakness 03/25/2021   Numbness 03/25/2021   Carbuncle 03/21/2021   Normocytic anemia 03/21/2021   Otalgia, left 03/21/2021   Multiple sclerosis ([place]) 03/18/2021    Kerra Guilfoil W., PT 06/17/2021, 2:30 PM  [facility] [address] [place], [place], [address] Phone: [phone]   Fax:  [phone]  Name: Janice Rose MRN: [id number] Date of Birth: 07-27-1980

## 2021-06-19 ENCOUNTER — Ambulatory Visit: Payer: Commercial Managed Care - HMO | Admitting: Physical Therapy

## 2021-06-22 ENCOUNTER — Telehealth: Payer: Self-pay | Admitting: Neurology

## 2021-06-22 ENCOUNTER — Ambulatory Visit: Payer: 59

## 2021-06-22 NOTE — Telephone Encounter (Signed)
Yes, pt should keep scheduled appt. Per last OV note, Dr. Epimenio Foot wanted her to have f/u in 2-3 months. Can you please call pt back and let her know? Thank you

## 2021-06-22 NOTE — Telephone Encounter (Signed)
Patient has appt 2/28 wanted to know if she needed appt because she is supposed to redo blood work. So should she come back after blood results? Please advise patient. Thank you

## 2021-06-22 NOTE — Telephone Encounter (Signed)
Advised patient to keep the appt for 2/28 she will be here.

## 2021-06-23 ENCOUNTER — Ambulatory Visit (HOSPITAL_BASED_OUTPATIENT_CLINIC_OR_DEPARTMENT_OTHER): Payer: 59

## 2021-06-26 ENCOUNTER — Encounter: Payer: Self-pay | Admitting: Physical Therapy

## 2021-06-26 ENCOUNTER — Other Ambulatory Visit: Payer: Self-pay

## 2021-06-26 ENCOUNTER — Ambulatory Visit: Payer: Commercial Managed Care - HMO | Admitting: Physical Therapy

## 2021-06-26 DIAGNOSIS — M6281 Muscle weakness (generalized): Secondary | ICD-10-CM

## 2021-06-26 NOTE — Patient Instructions (Addendum)
Access Code: [email or web address] URL: [email or web address] Date: 06/26/2021 Prepared by: [facility]  Exercises Sit to stand in stride stance - 1 x daily - 5 x weekly - 1-2 sets - 5 reps Mini Squat - 1 x daily - 5 x weekly - 1-2 sets - 5 reps Side to side weightshift - 1 x daily - 5 x weekly - 1-2 sets - 5 reps Supine Scapular Retraction - 1 x daily - 5 x weekly - 1 sets - 10 reps Hooklying Single Knee to Chest - 1 x daily - 5 x weekly - 1 sets - 3 reps - 10 sec hold  Added 06/26/2021 Sitting Knee Extension with Resistance - 1 x daily - 3 x weekly - 1-2 sets - 10 reps-added green band Seated Hamstring Curls with Resistance - 1 x daily - 5 x weekly - 1-2 sets - 10 reps-added green band Standing  Push Up with Scapular Retraction at Wall - 1 x daily - 3-5 x weekly - 2 sets - 10 reps Wrist AROM Flexion Extension - 1 x daily - 3-5 x weekly - 1-2 sets - 10 reps Standing Forearm Pronation and Supination AROM - 1 x daily - 5 x weekly - 1-2 sets - 10 reps   Continue biceps and triceps AROM with holding light water bottle

## 2021-06-26 NOTE — Therapy (Signed)
[facility] [address], [place], [place], [address] Phone: [phone]   Fax:  [phone]  Physical Therapy Treatment  Patient Details  Name: Janice Rose MRN: [id number] Date of Birth: August 01, 1980 Referring Provider (PT): Arlice Colt   Encounter Date: 06/26/2021   PT End of Session - 06/26/21 0952     Visit Number 3    Number of Visits 7    Date for PT Re-Evaluation 07/22/21    Authorization Type [employer]    PT Start Time [phone]   Pt arrives late   PT Stop Time 0935    PT Time Calculation (min) 39 min    Activity Tolerance Patient tolerated treatment well   RUE fatigues with repetitive, sustained activity   Behavior During Therapy WFL for tasks assessed/performed             Past Medical History:  Diagnosis Date   MS (multiple sclerosis) ([place])     Past Surgical History:  Procedure Laterality Date   none      There were no vitals filed for this visit.   Subjective Assessment - 06/26/21 0857     Subjective Have increased the walking-about 2 miles at once.  Still having some shaking and weakness with the R arm.  Feel the leg is better.    Pertinent History PMH: MS (diagnosed in 2005)    Patient Stated Goals To strengthen R side after the spasms.    Currently in Pain? No/denies                               [facility] Adult PT Treatment/Exercise - 06/26/21 0001       Exercises   Exercises Shoulder;Wrist      Knee/Hip Exercises: Seated   Long Arc Quad Strengthening;Right;Left;1 set;Limitations;10 reps    Long Arc Quad Limitations green theraband    Hamstring Curl Strengthening;Right;Left;1 set;Limitations;10 reps    Hamstring Limitations green theraband      Shoulder Exercises: Seated   Other Seated Exercises UBE and foot bike 3 minutes, Level 1.5      Shoulder Exercises: Standing   External Rotation Strengthening;Right;5 reps;Theraband    Theraband Level (Shoulder External Rotation)  Level 1 (Yellow)    External Rotation Limitations cues for elbow poistioning    Internal Rotation Right;Left;Strengthening;5 reps;Theraband    Theraband Level (Shoulder Internal Rotation) Level 1 (Yellow)    Internal Rotation Limitations cues for elbow positioning    Extension Strengthening;Both;10 reps;Theraband    Theraband Level (Shoulder Extension) Level 1 (Yellow)    Retraction AROM;Strengthening;Both;10 reps;Theraband    Theraband Level (Shoulder Retraction) Level 1 (Yellow)    Other Standing Exercises Wall push-ups x 10 reps      Wrist Exercises   Wrist Extension AROM;Right;5 reps;Seated    Wrist Extension Limitations Performed x 2 sets, cues to hold hands at rest in more extension position versus closed in fist    Other wrist exercises Forearm supination/pronation 5 reps, 2 sets, cues for open hand position                     PT Education - 06/26/21 0951     Education Details Updates to HEP; pt verbalizes understanding of breaking up exercises on varied days/times;  discussed gradual progression of UE strengthening and importance of open hand/stretch position to utlize extensor muscles in wrist and hand.    Person(s) Educated Patient  Methods Explanation;Demonstration;Handout    Comprehension Verbalized understanding;Returned demonstration              PT Short Term Goals - 06/26/21 0956       PT SHORT TERM GOAL #1   Title Pt will be independent with HEP for improved strength, balance, transfers, and gait.  TARGET 06/26/2021    Baseline demo and veralize    Time 2    Period Weeks    Status Achieved               PT Long Term Goals - 06/26/21 0957       PT LONG TERM GOAL #1   Title Pt will verbalize plans for continued community fitness upon d/c from PT.  TARGET 07/10/2021    Time 5    Period Weeks    Status On-going      PT LONG TERM GOAL #2   Title Pt will improve 5x sit<>stand to less than or equal to 11.5 sec to demonstrate improved  functional strength and transfer efficiency.    Baseline 14.63 sec    Time 5    Period Weeks    Status On-going      PT LONG TERM GOAL #3   Title Pt will improve DGI score to at least 20/24 to decrease fall risk    Baseline 18/24    Time 5    Period Weeks    Status On-going                   Plan - 06/26/21 3299     Clinical Impression Statement Skilled PT session today focused on review/update of HEP as well as RUE strengthening.  Pt has met STG 1.  Progressed lower body strength exercises to green theraband; worked on R shoulder, foream, and wrist strenghtening, focusing on extensor muscles.  Pt continues to fatigue with RUE, but emphasized importance of holding R hand in open position and avoiding holding in fisted position to avoid complications and facilitate stretch and use of extensor muscles.  Pt tolerates updates to exercises well and appears to be performing them at home.    Personal Factors and Comorbidities Other   MS exacerbation 11/22, then muscle spasms going on at least 1 month   Examination-Activity Limitations Locomotion Level;Stand;Caring for Others    Examination-Participation Restrictions Community Activity;Meal Prep;Other   Childcare tasks   Stability/Clinical Decision Making Evolving/Moderate complexity    Rehab Potential Good    PT Frequency Other (comment)   2x/wk for 2 weeks, then 1x/wk for 2 weeks   PT Duration Other (comment)   plus eval visit   PT Treatment/Interventions ADLs/Self Care Home Management;Gait training;Functional mobility training;Therapeutic activities;Therapeutic exercise;Balance training;Neuromuscular re-education;Manual techniques;Patient/family education;Passive range of motion    PT Next Visit Plan Review HEP; RUE and RLE stretching and weightbearing strengthening-SLS, tandem stance, hip stability-possibility wall or counter push ups, step ups, TKE exercises    Consulted and Agree with Plan of Care Patient             Patient  will benefit from skilled therapeutic intervention in order to improve the following deficits and impairments:  Abnormal gait, Difficulty walking, Increased muscle spasms, Impaired tone, Decreased balance, Decreased mobility, Decreased strength  Visit Diagnosis: Muscle weakness (generalized)     Problem List Patient Active Problem List   Diagnosis Date Noted   Spasticity 04/28/2021   Muscle spasm 04/22/2021   High risk medication use 03/25/2021   Right hand weakness 03/25/2021  Numbness 03/25/2021   Carbuncle 03/21/2021   Normocytic anemia 03/21/2021   Otalgia, left 03/21/2021   Multiple sclerosis ([place]) 03/18/2021    Tavon Corriher W., PT 06/26/2021, 9:57 AM  [facility] [place] [address], [place], [place], [address] Phone: [phone]   Fax:  [phone]  Name: Janice Rose MRN: [id number] Date of Birth: [birth year]

## 2021-06-30 ENCOUNTER — Ambulatory Visit: Payer: Managed Care, Other (non HMO) | Admitting: Neurology

## 2021-06-30 ENCOUNTER — Encounter: Payer: Self-pay | Admitting: Neurology

## 2021-06-30 VITALS — BP 108/77 | HR 94 | Ht 62.0 in | Wt 137.0 lb

## 2021-06-30 DIAGNOSIS — Z79899 Other long term (current) drug therapy: Secondary | ICD-10-CM

## 2021-06-30 DIAGNOSIS — M62838 Other muscle spasm: Secondary | ICD-10-CM

## 2021-06-30 DIAGNOSIS — R200 Anesthesia of skin: Secondary | ICD-10-CM | POA: Diagnosis not present

## 2021-06-30 DIAGNOSIS — G35 Multiple sclerosis: Secondary | ICD-10-CM

## 2021-06-30 DIAGNOSIS — R29898 Other symptoms and signs involving the musculoskeletal system: Secondary | ICD-10-CM
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
## 2021-07-01 ENCOUNTER — Ambulatory Visit: Payer: Managed Care, Other (non HMO) | Admitting: Physical Therapy

## 2021-07-01 LAB — HEPATIC FUNCTION PANEL
ALT: 14 IU/L (ref 0–32)
AST: 15 IU/L (ref 0–40)
Albumin: 4.6 g/dL (ref 3.8–4.8)
Alkaline Phosphatase: 64 IU/L (ref 44–121)
Bilirubin Total: <0.2 mg/dL (ref 0.0–1.2)
Bilirubin, Direct: <0.1 mg/dL (ref 0.00–0.40)
Total Protein: 6.9 g/dL (ref 6.0–8.5)

## 2021-07-08 ENCOUNTER — Ambulatory Visit: Payer: Managed Care, Other (non HMO) | Admitting: Physical Therapy

## 2021-08-10 ENCOUNTER — Other Ambulatory Visit: Payer: Self-pay | Admitting: Neurology

## 2021-08-11 ENCOUNTER — Other Ambulatory Visit: Payer: Self-pay

## 2021-08-11 MED ORDER — CYANOCOBALAMIN 1000 MCG/ML IJ SOLN: INTRAMUSCULAR | 0 refills | Status: DC

## 2021-08-18 ENCOUNTER — Ambulatory Visit: Payer: 59 | Admitting: Neurology

## 2021-08-24 ENCOUNTER — Encounter: Payer: Self-pay | Admitting: Neurology

## 2021-10-05 ENCOUNTER — Other Ambulatory Visit: Payer: 59

## 2021-10-13 ENCOUNTER — Encounter: Payer: Self-pay | Admitting: Neurology

## 2021-10-13 ENCOUNTER — Ambulatory Visit: Payer: Commercial Managed Care - HMO | Admitting: Neurology

## 2021-10-13 ENCOUNTER — Telehealth: Payer: Self-pay | Admitting: Neurology

## 2021-10-13 ENCOUNTER — Ambulatory Visit
Admission: RE | Admit: 2021-10-13 | Discharge: 2021-10-13 | Disposition: A | Discharge status: Home / Self Care | Payer: Commercial Managed Care - HMO | Source: Ambulatory Visit | Attending: Neurology | Admitting: Neurology

## 2021-10-13 DIAGNOSIS — G379 Demyelinating disease of central nervous system, unspecified: Secondary | ICD-10-CM

## 2021-10-13 DIAGNOSIS — G3781 Myelin oligodendrocyte glycoprotein antibody disease: Secondary | ICD-10-CM | POA: Insufficient documentation

## 2021-10-13 DIAGNOSIS — R200 Anesthesia of skin: Secondary | ICD-10-CM

## 2021-10-13 DIAGNOSIS — G35 Multiple sclerosis: Secondary | ICD-10-CM

## 2021-10-13 DIAGNOSIS — M62838 Other muscle spasm: Secondary | ICD-10-CM

## 2021-10-13 DIAGNOSIS — Z79899 Other long term (current) drug therapy: Secondary | ICD-10-CM | POA: Diagnosis not present

## 2021-10-13 DIAGNOSIS — E559 Vitamin D deficiency, unspecified: Secondary | ICD-10-CM

## 2021-10-13 MED ORDER — GADOBENATE DIMEGLUMINE 529 MG/ML IV SOLN
13.0000 mL | Freq: Once | INTRAVENOUS | Status: AC | PRN
  Administered 2021-10-13: 13 mL via INTRAVENOUS

## 2021-10-13 NOTE — Telephone Encounter (Signed)
Called pt at [phone]. Advised I spoke w/ infusion suite and they can work her in at 3:30pm today for day 1 of steroids. She accepted. I provided orders below to infusion suite.

## 2021-10-13 NOTE — Telephone Encounter (Signed)
I spoke to her about the results of the MRI.  It shows an enhancing lesion at the medial junction of the right temporal intracerebral lobes consistent with acute demyelination.  She has noted some foggy sensation feelings and headache over the last 2 weeks and these could certainly be related.  We will do 3 days of high-dose steroids.  Additionally, because she is having significant breakthrough activity I feel that she should switch from dimethyl fumarate to a more efficacious medication.  I will try to talk to her when she comes in for the steroids.  I am also going to check an anti-NMO antibody and anti-MOG antibody.
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
## 2021-10-14 ENCOUNTER — Other Ambulatory Visit: Payer: Self-pay | Admitting: Neurology

## 2021-10-14 ENCOUNTER — Encounter: Payer: Self-pay | Admitting: Neurology

## 2021-10-14 MED ORDER — INDOMETHACIN 25 MG PO CAPS: 25.0000 mg | ORAL_CAPSULE | Freq: Three times a day (TID) | ORAL | 1 refills | Status: DC | PRN

## 2021-10-16 LAB — CBC WITH DIFFERENTIAL/PLATELET
Basophils Absolute: 0.1 10*3/uL (ref 0.0–0.2)
Basos: 1 %
EOS (ABSOLUTE): 0 10*3/uL (ref 0.0–0.4)
Eos: 1 %
Hematocrit: 38 % (ref 34.0–46.6)
Hemoglobin: 12.4 g/dL (ref 11.1–15.9)
Immature Grans (Abs): 0 10*3/uL (ref 0.0–0.1)
Immature Granulocytes: 0 %
Lymphocytes Absolute: 3.7 10*3/uL — ABNORMAL HIGH (ref 0.7–3.1)
Lymphs: 44 %
MCH: 31.1 pg (ref 26.6–33.0)
MCHC: 32.6 g/dL (ref 31.5–35.7)
MCV: 95 fL (ref 79–97)
Monocytes Absolute: 0.6 10*3/uL (ref 0.1–0.9)
Monocytes: 7 %
Neutrophils Absolute: 4 10*3/uL (ref 1.4–7.0)
Neutrophils: 47 %
Platelets: 329 10*3/uL (ref 150–450)
RBC: 3.99 x10E6/uL (ref 3.77–5.28)
RDW: 13.6 % (ref 11.7–15.4)
WBC: 8.5 10*3/uL (ref 3.4–10.8)

## 2021-10-16 LAB — COMPREHENSIVE METABOLIC PANEL
ALT: 13 IU/L (ref 0–32)
AST: 13 IU/L (ref 0–40)
Albumin/Globulin Ratio: 1.7 (ref 1.2–2.2)
Albumin: 4.5 g/dL (ref 3.8–4.8)
Alkaline Phosphatase: 72 IU/L (ref 44–121)
BUN/Creatinine Ratio: 16 (ref 9–23)
BUN: 7 mg/dL (ref 6–24)
Bilirubin Total: <0.2 mg/dL (ref 0.0–1.2)
CO2: 25 mmol/L (ref 20–29)
Calcium: 9.7 mg/dL (ref 8.7–10.2)
Chloride: 104 mmol/L (ref 96–106)
Creatinine, Ser: 0.45 mg/dL — ABNORMAL LOW (ref 0.57–1.00)
Globulin, Total: 2.7 g/dL (ref 1.5–4.5)
Glucose: 85 mg/dL (ref 70–99)
Potassium: 4.3 mmol/L (ref 3.5–5.2)
Sodium: 144 mmol/L (ref 134–144)
Total Protein: 7.2 g/dL (ref 6.0–8.5)
eGFR: 124 mL/min/{1.73_m2} (ref >59)

## 2021-10-16 LAB — NEUROMYELITIS OPTICA AUTOAB, IGG: NMO IgG Autoantibodies: <1.5 U/mL (ref 0.0–3.0)

## 2021-10-16 LAB — ANTI-MOG, SERUM: MOG Antibody, Cell-based IFA: POSITIVE — AB

## 2021-10-16 LAB — ANTI-MOG ANTIBODY TITER, SERUM: Anti-MOG Antibody Titer, Serum: 1:32 {titer}

## 2021-10-19 ENCOUNTER — Encounter: Payer: Self-pay | Admitting: Physical Therapy

## 2021-10-19 ENCOUNTER — Telehealth: Payer: Self-pay | Admitting: Neurology

## 2021-10-19 DIAGNOSIS — G378 Other specified demyelinating diseases of central nervous system: Secondary | ICD-10-CM

## 2021-10-19 MED ORDER — PREDNISONE 20 MG PO TABS: 40.0000 mg | ORAL_TABLET | Freq: Every day | ORAL | 3 refills | Status: DC

## 2021-10-19 NOTE — Telephone Encounter (Signed)
Pt's sister Moashomi on [place] called needing to speak to the RN regarding the pt's flare ups and symptoms she has been having. Moashomi is wanting to be advised.

## 2021-10-19 NOTE — Telephone Encounter (Signed)
MD replied to mychart. He is reaching out to pt this afternoon.

## 2021-10-19 NOTE — Therapy (Signed)
[facility] [address] [place], [place], [address] Phone: [phone]   Fax:  [phone]  Patient Details  Name: Janice Rose MRN: [id number] Date of Birth: February 22, 1981 Referring Provider:  No ref. provider found  Encounter Date: 10/19/2021  PHYSICAL THERAPY DISCHARGE SUMMARY  Visits from Start of Care: 3 (last PT visit 06/30/21)  Current functional level related to goals / functional outcomes: Unable to fully assess goals, as pt did not return to PT after 3rd visit.   Remaining deficits: See eval/PT notes   Education / Equipment: Initiated HEP    Patient goals were not met. Patient is being discharged due to not returning since the last visit.   Frazier Butt., PT 10/19/2021, 3:02 PM  [facility] [address], [place], [place], [address] Phone: [phone]   Fax:  [phone]

## 2021-10-19 NOTE — Telephone Encounter (Signed)
I had a long discussion with Ms. Janice Rose and her sister about the recent lab work.  She is anti-MOG positive.  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  Of note, in 2005 she did have a lesion in the cervical spine.  She has never had optic neuritis.  MRI last year showed a subtle focus at C4-C5 and multiple lesions in the brain that were consistent with MS.  She had the 1 large enhancing lesion on the left at that time.  More recently, last week, a surveillance MRI while she was experiencing some additional symptoms showed a moderately large right hemispheric lesion.  The somewhat unusual appearance of the lesion prompted me to also check anti-MOG and anti-NMO.  We had a long discussion about treatment for anti-MOG.  As she has had 2 relapses in the last year, we need to initiate a disease modifying therapy.  There is evidence that Actemra and Rituxan can both help treat MOGAD.  More recent data suggest that Actemra might be superior between those 2.  Therefore, I would like to get her started on Actemra.  We checked lab work last week.  We will get her started and in 3 months I will check some additional lab work including a lipid panel.  I will also place her on 40 mg daily prednisone and start tapering it after she gets on the Actemra

## 2021-10-20 NOTE — Telephone Encounter (Signed)
Faxed actemra order to [employer]. Received a receipt of confirmation.  Completed PA for actemra. Marked it urgent. Sent to [employer]. Key: [id number].

## 2021-10-22 ENCOUNTER — Ambulatory Visit: Payer: Self-pay | Admitting: Neurology

## 2021-10-26 NOTE — Telephone Encounter (Addendum)
PA for actemra was denied by [facility] for MOGAD not being an FDA approved indication. We will appeal this decision.

## 2021-10-27 ENCOUNTER — Ambulatory Visit: Payer: Self-pay | Admitting: Neurology

## 2021-10-27 NOTE — Telephone Encounter (Signed)
Expedited appeal faxed to [employer] at [phone]. Received a receipt of confirmation.

## 2021-11-02 NOTE — Telephone Encounter (Signed)
Faxed completed/signed prescriber foundation form to [place] at [phone]. Received fax confirmation.

## 2021-11-04 NOTE — Telephone Encounter (Signed)
Received fax from [facility] patient foundation that patient approved to receive Actemra free of charge.

## 2021-11-09 ENCOUNTER — Encounter: Payer: Self-pay | Admitting: Neurology

## 2021-11-23 ENCOUNTER — Other Ambulatory Visit: Payer: Self-pay

## 2021-11-23 MED ORDER — ACTEMRA ACTPEN 162 MG/0.9ML ~~LOC~~ SOAJ: 162.0000 mg | SUBCUTANEOUS | 3 refills | Status: DC

## 2021-12-12 ENCOUNTER — Other Ambulatory Visit: Payer: Self-pay | Admitting: Neurology

## 2021-12-16 ENCOUNTER — Ambulatory Visit: Payer: Commercial Managed Care - HMO | Admitting: Neurology

## 2021-12-16 ENCOUNTER — Encounter: Payer: Self-pay | Admitting: Neurology

## 2021-12-16 VITALS — BP 112/76 | HR 86 | Ht 62.0 in | Wt 139.0 lb

## 2021-12-16 DIAGNOSIS — G378 Other specified demyelinating diseases of central nervous system: Secondary | ICD-10-CM | POA: Diagnosis not present

## 2021-12-16 DIAGNOSIS — Z79899 Other long term (current) drug therapy: Secondary | ICD-10-CM

## 2021-12-16 DIAGNOSIS — R200 Anesthesia of skin: Secondary | ICD-10-CM | POA: Diagnosis not present

## 2021-12-16 DIAGNOSIS — E559 Vitamin D deficiency, unspecified: Secondary | ICD-10-CM | POA: Diagnosis not present

## 2021-12-16 MED ORDER — PREDNISONE 5 MG PO TABS: ORAL_TABLET | ORAL | 0 refills | Status: DC

## 2021-12-16 NOTE — Patient Instructions (Signed)
PREDNISONE 30 mg x 1 week 20 mg x 1 week 10 mg x 1 week 5 mg x 2 weeks 2.5 mg x 2 weeks Then STOP
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
## 2021-12-17 ENCOUNTER — Other Ambulatory Visit (INDEPENDENT_AMBULATORY_CARE_PROVIDER_SITE_OTHER): Payer: Self-pay

## 2021-12-17 DIAGNOSIS — G378 Other specified demyelinating diseases of central nervous system: Secondary | ICD-10-CM

## 2021-12-17 DIAGNOSIS — Z0289 Encounter for other administrative examinations: Secondary | ICD-10-CM

## 2021-12-17 DIAGNOSIS — E559 Vitamin D deficiency, unspecified: Secondary | ICD-10-CM

## 2021-12-17 DIAGNOSIS — Z79899 Other long term (current) drug therapy: Secondary | ICD-10-CM

## 2021-12-17 DIAGNOSIS — R200 Anesthesia of skin: Secondary | ICD-10-CM

## 2021-12-18 LAB — LIPID PANEL
Chol/HDL Ratio: 3.5 ratio (ref 0.0–4.4)
Cholesterol, Total: 281 mg/dL — ABNORMAL HIGH (ref 100–199)
HDL: 80 mg/dL (ref >39)
LDL Chol Calc (NIH): 171 mg/dL — ABNORMAL HIGH (ref 0–99)
Triglycerides: 170 mg/dL — ABNORMAL HIGH (ref 0–149)
VLDL Cholesterol Cal: 30 mg/dL (ref 5–40)

## 2021-12-18 LAB — CBC WITH DIFFERENTIAL/PLATELET
Basophils Absolute: 0 10*3/uL (ref 0.0–0.2)
Basos: 0 %
EOS (ABSOLUTE): 0 10*3/uL (ref 0.0–0.4)
Eos: 0 %
Hematocrit: 41 % (ref 34.0–46.6)
Hemoglobin: 13.3 g/dL (ref 11.1–15.9)
Immature Grans (Abs): 0.1 10*3/uL (ref 0.0–0.1)
Immature Granulocytes: 1 %
Lymphocytes Absolute: 2.6 10*3/uL (ref 0.7–3.1)
Lymphs: 24 %
MCH: 31.1 pg (ref 26.6–33.0)
MCHC: 32.4 g/dL (ref 31.5–35.7)
MCV: 96 fL (ref 79–97)
Monocytes Absolute: 0.9 10*3/uL (ref 0.1–0.9)
Monocytes: 8 %
Neutrophils Absolute: 7.3 10*3/uL — ABNORMAL HIGH (ref 1.4–7.0)
Neutrophils: 67 %
Platelets: 290 10*3/uL (ref 150–450)
RBC: 4.27 x10E6/uL (ref 3.77–5.28)
RDW: 13.9 % (ref 11.7–15.4)
WBC: 10.9 10*3/uL — ABNORMAL HIGH (ref 3.4–10.8)

## 2021-12-18 LAB — VITAMIN D 25 HYDROXY (VIT D DEFICIENCY, FRACTURES): Vit D, 25-Hydroxy: 43.5 ng/mL (ref 30.0–100.0)

## 2021-12-18 LAB — COMPREHENSIVE METABOLIC PANEL
ALT: 18 IU/L (ref 0–32)
AST: 12 IU/L (ref 0–40)
Albumin/Globulin Ratio: 2 (ref 1.2–2.2)
Albumin: 4.2 g/dL (ref 3.9–4.9)
Alkaline Phosphatase: 62 IU/L (ref 44–121)
BUN/Creatinine Ratio: 16 (ref 9–23)
BUN: 9 mg/dL (ref 6–24)
Bilirubin Total: 0.3 mg/dL (ref 0.0–1.2)
CO2: 22 mmol/L (ref 20–29)
Calcium: 9 mg/dL (ref 8.7–10.2)
Chloride: 105 mmol/L (ref 96–106)
Creatinine, Ser: 0.56 mg/dL — ABNORMAL LOW (ref 0.57–1.00)
Globulin, Total: 2.1 g/dL (ref 1.5–4.5)
Glucose: 63 mg/dL — ABNORMAL LOW (ref 70–99)
Potassium: 3.9 mmol/L (ref 3.5–5.2)
Sodium: 142 mmol/L (ref 134–144)
Total Protein: 6.3 g/dL (ref 6.0–8.5)
eGFR: 118 mL/min/{1.73_m2} (ref >59)

## 2021-12-18 LAB — VITAMIN B12: Vitamin B-12: >2000 pg/mL — ABNORMAL HIGH (ref 232–1245)

## 2021-12-27 ENCOUNTER — Encounter: Payer: Self-pay | Admitting: Neurology

## 2021-12-30 ENCOUNTER — Encounter: Payer: Self-pay | Admitting: Neurology

## 2022-01-28 ENCOUNTER — Other Ambulatory Visit: Payer: Self-pay | Admitting: Neurology

## 2022-01-28 MED ORDER — ACTEMRA ACTPEN 162 MG/0.9ML ~~LOC~~ SOAJ: 162.0000 mg | SUBCUTANEOUS | 3 refills | Status: DC

## 2022-02-02 ENCOUNTER — Other Ambulatory Visit: Payer: Self-pay | Admitting: *Deleted

## 2022-02-02 ENCOUNTER — Other Ambulatory Visit: Payer: Self-pay | Admitting: Neurology

## 2022-02-02 ENCOUNTER — Encounter: Payer: Self-pay | Admitting: Neurology

## 2022-02-02 MED ORDER — ACTEMRA ACTPEN 162 MG/0.9ML ~~LOC~~ SOAJ: 162.0000 mg | SUBCUTANEOUS | 3 refills | Status: DC

## 2022-02-02 MED ORDER — PREDNISONE 5 MG PO TABS: ORAL_TABLET | ORAL | 0 refills | Status: DC

## 2022-02-05 ENCOUNTER — Telehealth: Payer: Self-pay | Admitting: Neurology

## 2022-02-05 NOTE — Telephone Encounter (Signed)
[employer] sent to GI they obtain auth. To be scheduled in mid December per Dr. Felecia Shelling. [phone]

## 2022-02-08 ENCOUNTER — Other Ambulatory Visit: Payer: Self-pay | Admitting: *Deleted

## 2022-02-08 MED ORDER — PREDNISONE 5 MG PO TABS: 5.0000 mg | ORAL_TABLET | Freq: Every day | ORAL | 0 refills | Status: DC

## 2022-02-24 ENCOUNTER — Other Ambulatory Visit: Payer: Self-pay | Admitting: Neurology

## 2022-02-24 ENCOUNTER — Other Ambulatory Visit: Payer: Self-pay | Admitting: *Deleted

## 2022-02-24 ENCOUNTER — Encounter: Payer: Self-pay | Admitting: *Deleted

## 2022-03-01 ENCOUNTER — Encounter: Payer: Self-pay | Admitting: Neurology

## 2022-03-16 ENCOUNTER — Other Ambulatory Visit: Payer: Self-pay | Admitting: *Deleted

## 2022-03-16 MED ORDER — PREDNISONE 5 MG PO TABS: 5.0000 mg | ORAL_TABLET | Freq: Every day | ORAL | 0 refills | Status: DC

## 2022-04-12 ENCOUNTER — Encounter: Payer: Self-pay | Admitting: Neurology

## 2022-04-13 ENCOUNTER — Encounter: Payer: Self-pay | Admitting: Neurology

## 2022-04-13 ENCOUNTER — Ambulatory Visit: Payer: Commercial Managed Care - HMO | Admitting: Neurology

## 2022-04-13 VITALS — BP 112/75 | HR 98 | Ht 61.0 in | Wt 144.0 lb

## 2022-04-13 DIAGNOSIS — G3781 Myelin oligodendrocyte glycoprotein antibody disease: Secondary | ICD-10-CM

## 2022-04-13 DIAGNOSIS — R200 Anesthesia of skin: Secondary | ICD-10-CM

## 2022-04-13 DIAGNOSIS — E538 Deficiency of other specified B group vitamins: Secondary | ICD-10-CM

## 2022-04-13 DIAGNOSIS — R252 Cramp and spasm: Secondary | ICD-10-CM

## 2022-04-13 DIAGNOSIS — Z79899 Other long term (current) drug therapy: Secondary | ICD-10-CM | POA: Diagnosis not present

## 2022-04-13 DIAGNOSIS — M62838 Other muscle spasm: Secondary | ICD-10-CM | POA: Diagnosis not present

## 2022-04-13 DIAGNOSIS — E559 Vitamin D deficiency, unspecified: Secondary | ICD-10-CM

## 2022-04-13 DIAGNOSIS — M2550 Pain in unspecified joint: Secondary | ICD-10-CM

## 2022-04-13 MED ORDER — MELOXICAM 7.5 MG PO TABS: 7.5000 mg | ORAL_TABLET | Freq: Every day | ORAL | 5 refills | Status: DC

## 2022-04-13 MED ORDER — PREDNISONE 1 MG PO TABS: ORAL_TABLET | ORAL | 1 refills | Status: DC
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
## 2022-04-15 LAB — VITAMIN D 25 HYDROXY (VIT D DEFICIENCY, FRACTURES): Vit D, 25-Hydroxy: 59.7 ng/mL (ref 30.0–100.0)

## 2022-04-15 LAB — ANCA PROFILE
Anti-MPO Antibodies: <0.2 units (ref 0.0–0.9)
Anti-PR3 Antibodies: <0.2 units (ref 0.0–0.9)
Atypical pANCA: <1:20 {titer}
C-ANCA: <1:20 {titer}
P-ANCA: <1:20 {titer}

## 2022-04-15 LAB — CBC WITH DIFFERENTIAL/PLATELET
Basophils Absolute: 0.1 10*3/uL (ref 0.0–0.2)
Basos: 1 %
EOS (ABSOLUTE): 0.1 10*3/uL (ref 0.0–0.4)
Eos: 1 %
Hematocrit: 41.7 % (ref 34.0–46.6)
Hemoglobin: 13.8 g/dL (ref 11.1–15.9)
Immature Grans (Abs): 0 10*3/uL (ref 0.0–0.1)
Immature Granulocytes: 0 %
Lymphocytes Absolute: 2.9 10*3/uL (ref 0.7–3.1)
Lymphs: 40 %
MCH: 32.6 pg (ref 26.6–33.0)
MCHC: 33.1 g/dL (ref 31.5–35.7)
MCV: 99 fL — ABNORMAL HIGH (ref 79–97)
Monocytes Absolute: 0.7 10*3/uL (ref 0.1–0.9)
Monocytes: 10 %
Neutrophils Absolute: 3.4 10*3/uL (ref 1.4–7.0)
Neutrophils: 48 %
Platelets: 258 10*3/uL (ref 150–450)
RBC: 4.23 x10E6/uL (ref 3.77–5.28)
RDW: 12.7 % (ref 11.7–15.4)
WBC: 7.2 10*3/uL (ref 3.4–10.8)

## 2022-04-15 LAB — ANTI-MOG ANTIBODY TITER, SERUM: Anti-MOG Antibody Titer, Serum: 1:32 {titer}

## 2022-04-15 LAB — COMPREHENSIVE METABOLIC PANEL
ALT: 20 IU/L (ref 0–32)
AST: 17 IU/L (ref 0–40)
Albumin/Globulin Ratio: 2.3 — ABNORMAL HIGH (ref 1.2–2.2)
Albumin: 4.5 g/dL (ref 3.9–4.9)
Alkaline Phosphatase: 59 IU/L (ref 44–121)
BUN/Creatinine Ratio: 13 (ref 9–23)
BUN: 8 mg/dL (ref 6–24)
Bilirubin Total: <0.2 mg/dL (ref 0.0–1.2)
CO2: 24 mmol/L (ref 20–29)
Calcium: 9.4 mg/dL (ref 8.7–10.2)
Chloride: 105 mmol/L (ref 96–106)
Creatinine, Ser: 0.63 mg/dL (ref 0.57–1.00)
Globulin, Total: 2 g/dL (ref 1.5–4.5)
Glucose: 72 mg/dL (ref 70–99)
Potassium: 4 mmol/L (ref 3.5–5.2)
Sodium: 141 mmol/L (ref 134–144)
Total Protein: 6.5 g/dL (ref 6.0–8.5)
eGFR: 114 mL/min/{1.73_m2} (ref >59)

## 2022-04-15 LAB — ANTI-MOG, SERUM: MOG Antibody, Cell-based IFA: POSITIVE — AB

## 2022-04-15 LAB — VITAMIN B12: Vitamin B-12: 1969 pg/mL — ABNORMAL HIGH (ref 232–1245)

## 2022-04-15 LAB — ANA+ENA+DNA/DS+SCL 70+SJOSSA/B
ANA Titer 1: NEGATIVE
ENA RNP Ab: 0.2 AI (ref 0.0–0.9)
ENA SM Ab Ser-aCnc: <0.2 AI (ref 0.0–0.9)
ENA SSA (RO) Ab: <0.2 AI (ref 0.0–0.9)
ENA SSB (LA) Ab: <0.2 AI (ref 0.0–0.9)
Scleroderma (Scl-70) (ENA) Antibody, IgG: <0.2 AI (ref 0.0–0.9)
dsDNA Ab: <1 IU/mL (ref 0–9)

## 2022-04-15 LAB — SEDIMENTATION RATE: Sed Rate: 2 mm/hr (ref 0–32)

## 2022-04-15 LAB — C-REACTIVE PROTEIN: CRP: <1 mg/L (ref 0–10)

## 2022-04-18 ENCOUNTER — Ambulatory Visit
Admission: RE | Admit: 2022-04-18 | Discharge: 2022-04-18 | Disposition: A | Discharge status: Home / Self Care | Payer: Commercial Managed Care - HMO | Source: Ambulatory Visit | Attending: Neurology | Admitting: Neurology

## 2022-04-18 DIAGNOSIS — G3781 Myelin oligodendrocyte glycoprotein antibody disease: Secondary | ICD-10-CM

## 2022-04-18 DIAGNOSIS — R200 Anesthesia of skin: Secondary | ICD-10-CM

## 2022-04-18 MED ORDER — GADOPICLENOL 0.5 MMOL/ML IV SOLN
6.0000 mL | Freq: Once | INTRAVENOUS | Status: AC | PRN
  Administered 2022-04-18: 6 mL via INTRAVENOUS

## 2022-04-21 ENCOUNTER — Other Ambulatory Visit: Payer: Commercial Managed Care - HMO

## 2022-04-22 ENCOUNTER — Encounter: Payer: Self-pay | Admitting: Neurology

## 2022-04-26 ENCOUNTER — Other Ambulatory Visit: Payer: Self-pay | Admitting: Neurology

## 2022-04-28 ENCOUNTER — Ambulatory Visit: Payer: Commercial Managed Care - HMO | Admitting: Neurology

## 2022-05-31 ENCOUNTER — Encounter: Payer: Self-pay | Admitting: Neurology

## 2022-06-07 ENCOUNTER — Encounter: Payer: Self-pay | Admitting: Cardiology

## 2022-06-07 ENCOUNTER — Ambulatory Visit: Payer: 59 | Admitting: Cardiology

## 2022-06-07 ENCOUNTER — Telehealth: Payer: Self-pay

## 2022-06-07 VITALS — BP 113/73 | HR 82 | Resp 16 | Ht 61.0 in | Wt 142.4 lb

## 2022-06-07 DIAGNOSIS — E7800 Pure hypercholesterolemia, unspecified: Secondary | ICD-10-CM

## 2022-06-07 DIAGNOSIS — R0609 Other forms of dyspnea: Secondary | ICD-10-CM

## 2022-06-07 DIAGNOSIS — Z8249 Family history of ischemic heart disease and other diseases of the circulatory system: Secondary | ICD-10-CM | POA: Diagnosis not present

## 2022-06-07 DIAGNOSIS — G3781 Myelin oligodendrocyte glycoprotein antibody disease: Secondary | ICD-10-CM

## 2022-06-07 DIAGNOSIS — E781 Pure hyperglyceridemia: Secondary | ICD-10-CM

## 2022-06-07 NOTE — Progress Notes (Signed)
ID:  DAWNIELLE CHRISTIANA, DOB 1980/10/05, MRN [id number]  PCP:  Lucianne Lei, MD  Cardiologist:  Tessa Lerner, DO, [facility] (established care 06/07/2022)  REASON FOR CONSULT: Family history of heart disease  REQUESTING PHYSICIAN:  Lucianne Lei, MD [address] [place] [place],  [place] [address]  Chief Complaint  Patient presents with   MOGAD    Myelin oligodendrocyte glycoprotein antibody disease   New Patient (Initial Visit)    Fam Hx heart disease.    HPI  Janice Rose is a 42 y.o. [place] descent female who presents to the clinic for evaluation of heart disease at the request of Lucianne Lei, MD. Her past medical history and cardiovascular risk factors include: myelin oligodendrocyte glycoprotein antibody disease, family history of heart disease, Hyperlipidemia.   Patient is accompanied by her husband at today's office visit for evaluation for possible CAD given her strong family history and recent being started on Actemra for myelin oligodendrocyte glycoprotein antibody disease.   She had a flare of approximately 6 months ago at that time was started on prednisone and later Actemra.  She recently really realized the medication side effect profile includes heart disease as well as hyperlipidemia.  Given her strong family history of CAD she is now referred to cardiology for further evaluation and management.  Review of systems are positive for dyspnea on exertion, chest heaviness, ankle swelling worse at night compared to the morning hours.  Dyspnea and chest heaviness with 2 independent events.  The chest heaviness happens randomly, not always brought on by effort, does not resolve with rest, usually self-limited.  Dyspnea on exertion more pronounced with going up a flight of stairs, denies orthopnea, PND.  She is now off of prednisone and still continues to have nonspecific joint pain which is currently being worked up by other providers in her care team.   Father had his first coronary  intervention after the age of 51 later followed by surgical vascularization.  Mother, and both sisters have cardiovascular risk factors such as hypertension and hyperlipidemia.  She had a relapsed around November 2022 and shortly thereafter was placed on treatment for myelin oligodendrocyte glycoprotein antibody disease.    FUNCTIONAL STATUS: Does yoga 2 or 3 times per week for 60 minutes.  ALLERGIES: Allergies  Allergen Reactions   Phenytoin Itching    MEDICATION LIST PRIOR TO VISIT: Current Meds  Medication Sig   ACTEMRA ACTPEN 162 MG/0.9ML SOAJ Inject 162 mg into the skin once a week.   OXcarbazepine (TRILEPTAL) 150 MG tablet TAKE ONE TABLET BY MOUTH EVERY MORNING AND TAKE TWO TABLETS BY MOUTH EVERY NIGHT AT BEDTIME     PAST MEDICAL HISTORY: Past Medical History:  Diagnosis Date   MS (multiple sclerosis) (HCC)    Myelin oligodendrocyte glycoprotein antibody disease     PAST SURGICAL HISTORY: Past Surgical History:  Procedure Laterality Date   none      FAMILY HISTORY: The patient family history includes Diabetes in her father; Heart disease in her father; Hyperlipidemia in her sister and sister; Hypertension in her father, mother, sister, and sister.  SOCIAL HISTORY:  The patient  reports that she has never smoked. She has never used smokeless tobacco. She reports current alcohol use. She reports that she does not use drugs.  REVIEW OF SYSTEMS: Review of Systems  Cardiovascular:  Positive for dyspnea on exertion and leg swelling (ankle). Negative for chest pain, claudication, irregular heartbeat, near-syncope, orthopnea, palpitations, paroxysmal nocturnal dyspnea and syncope.  Respiratory:  Negative for shortness of breath.   Hematologic/Lymphatic: Negative for bleeding problem.  Musculoskeletal:  Positive for joint pain. Negative for muscle cramps and myalgias.  Neurological:  Negative for dizziness and light-headedness.    PHYSICAL EXAM:    06/07/2022    8:26 AM  04/13/2022    8:36 AM 12/16/2021    3:21 PM  Vitals with BMI  Height 5\' 1"  5\' 1"  5\' 2"   Weight 142 lbs 6 oz 144 lbs 139 lbs  BMI 26.92 16.96 78.93  Systolic 810 175 102  Diastolic 73 75 76  Pulse 82 98 86    Physical Exam   CARDIAC DATABASE: EKG: 06/07/2022: Sinus Rhythm, LAE, normal axis, without underlying injury pattern.   Echocardiogram: No results found for this or any previous visit from the past 1095 days.    Stress Testing: No results found for this or any previous visit from the past 1095 days.   Heart Catheterization: None  LABORATORY DATA:    Latest Ref Rng & Units 04/13/2022    9:40 AM 12/17/2021    8:59 AM 10/13/2021    4:43 PM  CBC  WBC 3.4 - 10.8 x10E3/uL 7.2  10.9  8.5   Hemoglobin 11.1 - 15.9 g/dL 13.8  13.3  12.4   Hematocrit 34.0 - 46.6 % 41.7  41.0  38.0   Platelets 150 - 450 x10E3/uL 258  290  329        Latest Ref Rng & Units 04/13/2022    9:40 AM 12/17/2021    8:59 AM 10/13/2021    4:43 PM  CMP  Glucose 70 - 99 mg/dL 72  63  85   BUN 6 - 24 mg/dL 8  9  7    Creatinine 0.57 - 1.00 mg/dL 0.63  0.56  0.45   Sodium 134 - 144 mmol/L 141  142  144   Potassium 3.5 - 5.2 mmol/L 4.0  3.9  4.3   Chloride 96 - 106 mmol/L 105  105  104   CO2 20 - 29 mmol/L 24  22  25    Calcium 8.7 - 10.2 mg/dL 9.4  9.0  9.7   Total Protein 6.0 - 8.5 g/dL 6.5  6.3  7.2   Total Bilirubin 0.0 - 1.2 mg/dL <0.2  0.3  <0.2   Alkaline Phos 44 - 121 IU/L 59  62  72   AST 0 - 40 IU/L 17  12  13    ALT 0 - 32 IU/L 20  18  13      Lipid Panel     Component Value Date/Time   CHOL 281 (H) 12/17/2021 0859   TRIG 170 (H) 12/17/2021 0859   HDL 80 12/17/2021 0859   CHOLHDL 3.5 12/17/2021 0859   LDLCALC 171 (H) 12/17/2021 0859   LABVLDL 30 12/17/2021 0859    No components found for: "NTPROBNP" No results for input(s): "PROBNP" in the last 8760 hours. No results for input(s): "TSH" in the last 8760 hours.  BMP Recent Labs    10/13/21 1643 12/17/21 0859 04/13/22 0940   NA 144 142 141  K 4.3 3.9 4.0  CL 104 105 105  CO2 25 22 24   GLUCOSE 85 63* 72  BUN 7 9 8   CREATININE 0.45* 0.56* 0.63  CALCIUM 9.7 9.0 9.4    HEMOGLOBIN A1C No results found for: "HGBA1C", "MPG"  External Labs: Collected: June 11, 2021 labs available [facility]. BUN 9, creatinine 0.51. AST 44, ALT 75 (above normal limits). TSH 1.5.  Total cholesterol 266, triglycerides 174, HDL 61, LDL 173  Collected: July 22, 2020. Total cholesterol 233, triglycerides 158, HDL 55, LDL 150  IMPRESSION:    ICD-10-CM   1. Dyspnea on exertion  R06.09 PCV ECHOCARDIOGRAM COMPLETE    2. Family history of heart disease  Z82.49 PCV ECHOCARDIOGRAM COMPLETE    CT CARDIAC SCORING ([facility] LOCATIONS ONLY)    3. Pure hypercholesterolemia  E78.00 CT CARDIAC SCORING ([facility] LOCATIONS ONLY)    4. Hypertriglyceridemia  E78.1     5. Myelin oligodendrocyte glycoprotein antibody disorder (MOGAD)  G37.81 EKG 12-Lead       RECOMMENDATIONS: Janice Rose is a 42 y.o. [place] descent female whose past medical history and cardiac risk factors include:  myelin oligodendrocyte glycoprotein antibody disease, family history of heart disease, Hyperlipidemia.   Dyspnea on exertion Clinically euvolemic. Overall probability of CHF is low. Would like to proceed with echocardiogram to evaluate for structural heart disease. Monitor for now  Family history of heart disease Pure hypercholesterolemia Hypertriglyceridemia Patient is noted to have hyperlipidemia as early as February 2022 -do not have prior labs for review. Lipids have trended up after starting her medications for MOGAD.  Given her hyperlipidemia and family history of CAD shared decision was to proceed with coronary calcium score for further restratification.  Depending on her CAC will proceed with either exercise nuclear stress test versus coronary CTA.  Further recommendations to follow. Patient would like to be evaluated for heart disease as her  current medical therapy for MOGAD has been associated with cardiovascular disease. I have asked her to increase physical activity as tolerated with a goal of 30 minutes a day 5 days a week (I.e. walking) avoid overexertion.  Also encouraged her to reduce foods that are high in triglycerides.  After such interventions would recommend repeating a lipid profile and CMP in 6 weeks to reevaluate therapy.  Myelin oligodendrocyte glycoprotein antibody disorder (MOGAD) Currently on [place] neurology.  Data Reviewed: I have independently reviewed external notes provided by the referring provider as part of this office visit.   I have independently reviewed results of EKG, labs from [facility] as part of medical decision making. I have ordered the following tests:  Orders Placed This Encounter  Procedures   CT CARDIAC SCORING ([facility] LOCATIONS ONLY)    Standing Status:   Future    Standing Expiration Date:   06/08/2023    Order Specific Question:   Preferred imaging location?    Answer:   [facility]    Order Specific Question:   Release to patient    Answer:   Immediate    Order Specific Question:   Is patient pregnant?    Answer:   No   EKG 12-Lead   PCV ECHOCARDIOGRAM COMPLETE    Standing Status:   Future    Standing Expiration Date:   06/08/2023  I have made no medications changes at today's encounter as noted above.  FINAL MEDICATION LIST END OF ENCOUNTER: No orders of the defined types were placed in this encounter.   Medications Discontinued During This Encounter  Medication Reason   predniSONE (DELTASONE) 5 MG tablet Completed Course   predniSONE (DELTASONE) 1 MG tablet Completed Course   meloxicam (MOBIC) 7.5 MG tablet      Current Outpatient Medications:    ACTEMRA ACTPEN 162 MG/0.9ML SOAJ, Inject 162 mg into the skin once a week., Disp: 10.8 mL, Rfl: 3   OXcarbazepine (TRILEPTAL) 150 MG tablet, TAKE ONE TABLET BY  MOUTH EVERY MORNING AND TAKE TWO TABLETS BY MOUTH EVERY  NIGHT AT BEDTIME, Disp: 90 tablet, Rfl: 5  Orders Placed This Encounter  Procedures   CT CARDIAC SCORING ([facility] LOCATIONS ONLY)   EKG 12-Lead   PCV ECHOCARDIOGRAM COMPLETE    There are no Patient Instructions on file for this visit.   --Continue cardiac medications as reconciled in final medication list. --Return in about 6 weeks (around 07/19/2022) for Follow up, Review test results. or sooner if needed. --Continue follow-up with your primary care physician regarding the management of your other chronic comorbid conditions.  Patient's questions and concerns were addressed to her satisfaction. She voices understanding of the instructions provided during this encounter.   This note was created using a voice recognition software as a result there may be grammatical errors inadvertently enclosed that do not reflect the nature of this encounter. Every attempt is made to correct such errors.  Rex Kras, [place], [facility]  Pager: [phone] Office: [phone]

## 2022-06-07 NOTE — Telephone Encounter (Signed)
Please read Message from today 06/07/2022

## 2022-06-15 ENCOUNTER — Other Ambulatory Visit: Payer: Self-pay | Admitting: Neurology

## 2022-06-16 ENCOUNTER — Ambulatory Visit: Payer: Medicaid Other

## 2022-06-16 DIAGNOSIS — Z8249 Family history of ischemic heart disease and other diseases of the circulatory system: Secondary | ICD-10-CM

## 2022-06-16 DIAGNOSIS — R0609 Other forms of dyspnea: Secondary | ICD-10-CM

## 2022-06-29 ENCOUNTER — Telehealth: Payer: Self-pay | Admitting: Neurology

## 2022-06-29 NOTE — Telephone Encounter (Signed)
Stacey/ [facility] is calling. Stated the denial for medication ACTEMRA ACTPEN 162 MG/0.9ML SOAJ  was upheld because there was not enough documentation submitted.

## 2022-06-29 NOTE — Telephone Encounter (Signed)
Just wanting to see if you all submitted a PA for this? I don't see where we initiated on our end.

## 2022-07-01 NOTE — Telephone Encounter (Signed)
[place] appeals back and spoke with [place]. She received appeal denial and just wanted to update our office. She states appeal denied based on info submitted 10/27/21. Advised this was from last yr?  She is unsure why it took so long. Pt has been on therapy, getting assistance via [place]. Ref# [id number].    Called and spoke w/ Jeff/[employer]. Pt submitted a request for free drug program. Phone# [phone].  He transferred me and I spoke w/ Heather/free drug program. States she is approved. Annual re-verification not due until 10/06/22. Pt consent form expires 2029. Nothing needed from our office at this time.

## 2022-07-05 DIAGNOSIS — Z0000 Encounter for general adult medical examination without abnormal findings: Secondary | ICD-10-CM | POA: Diagnosis not present

## 2022-07-12 ENCOUNTER — Telehealth: Payer: Self-pay

## 2022-07-12 NOTE — Telephone Encounter (Signed)
  Pt calling for Calcium score results. 2nd time calling and has not heard back from [place].

## 2022-07-14 NOTE — Telephone Encounter (Signed)
I was going to discuss the results of the upcoming office visit.  Her total calcium score is 0.  Justin Buechner [place], DO, [facility]

## 2022-07-14 NOTE — Telephone Encounter (Signed)
Lvm for pt

## 2022-07-20 ENCOUNTER — Encounter: Payer: Self-pay | Admitting: Cardiology

## 2022-07-20 ENCOUNTER — Ambulatory Visit: Payer: 59 | Admitting: Cardiology

## 2022-07-20 VITALS — BP 98/68 | HR 73 | Resp 16 | Ht 61.0 in | Wt 144.0 lb

## 2022-07-20 DIAGNOSIS — Z8249 Family history of ischemic heart disease and other diseases of the circulatory system: Secondary | ICD-10-CM

## 2022-07-20 DIAGNOSIS — E781 Pure hyperglyceridemia: Secondary | ICD-10-CM

## 2022-07-20 DIAGNOSIS — R0609 Other forms of dyspnea: Secondary | ICD-10-CM | POA: Diagnosis not present

## 2022-07-20 DIAGNOSIS — G3781 Myelin oligodendrocyte glycoprotein antibody disease: Secondary | ICD-10-CM

## 2022-07-20 DIAGNOSIS — E7800 Pure hypercholesterolemia, unspecified: Secondary | ICD-10-CM | POA: Diagnosis not present

## 2022-07-20 NOTE — Progress Notes (Signed)
ID:  Janice Rose, DOB Aug 03, 1980, MRN [id number]  PCP:  Nicholos Johns, MD  Cardiologist:  Rex Kras, DO, [facility] (established care 06/07/2022)  Date: 07/20/22 Last Office Visit: 06/07/2022  Chief Complaint  Patient presents with   Shortness of Breath   Follow-up    HPI  Janice Rose is a 42 y.o. [place] descent female whose past medical history and cardiovascular risk factors include: myelin oligodendrocyte glycoprotein antibody disease, family history of heart disease, Hyperlipidemia.   She was referred to the practice for evaluation of possible CAD given her strong family history and recently being started on Actemra for myelin oligodendrocyte glycoprotein antibody disease.   Given her symptoms of dyspnea on exertion she was recommended to have an echocardiogram at the last office visit which notes preserved LVEF, normal diastolic function, and no significant valvular heart disease.  Given family history of CAD, pure hypercholesterolemia/hypertriglyceridemia shared decision was to proceed with a coronary calcium score for further risk stratification.  Her total CAC is 0.  Given her hypertriglyceridemia patient has implement lifestyle changes, increased physical activity as tolerated and to recheck lipids in 6 weeks to reevaluate her lipids.  She presents today for follow-up.  Since last office visit shortness of breath has improved.  Denies heart failure symptoms or anginal discomfort.  Overall functional capacity remains stable.  Father had his first coronary intervention after the age of 61 later followed by surgical vascularization.  Mother, and both sisters have cardiovascular risk factors such as hypertension and hyperlipidemia.  She had a relapsed around November 2022 and shortly thereafter was placed on treatment for myelin oligodendrocyte glycoprotein antibody disease.    FUNCTIONAL STATUS: Does yoga 2 or 3 times per week for 60 minutes.  ALLERGIES: Allergies  Allergen  Reactions   Phenytoin Itching    MEDICATION LIST PRIOR TO VISIT: Current Meds  Medication Sig   ACTEMRA ACTPEN 162 MG/0.9ML SOAJ Inject 162 mg into the skin once a week.   OXcarbazepine (TRILEPTAL) 150 MG tablet TAKE ONE TABLET BY MOUTH EVERY MORNING AND TAKE TWO TABLETS BY MOUTH EVERY NIGHT AT BEDTIME     PAST MEDICAL HISTORY: Past Medical History:  Diagnosis Date   MS (multiple sclerosis) ([place])    Myelin oligodendrocyte glycoprotein antibody disease     PAST SURGICAL HISTORY: Past Surgical History:  Procedure Laterality Date   none      FAMILY HISTORY: The patient family history includes Diabetes in her father; Heart disease in her father; Hyperlipidemia in her sister and sister; Hypertension in her father, mother, sister, and sister.  SOCIAL HISTORY:  The patient  reports that she has never smoked. She has never used smokeless tobacco. She reports current alcohol use. She reports that she does not use drugs.  REVIEW OF SYSTEMS: Review of Systems  Cardiovascular:  Negative for chest pain, claudication, dyspnea on exertion, irregular heartbeat, leg swelling, near-syncope, orthopnea, palpitations, paroxysmal nocturnal dyspnea and syncope.  Respiratory:  Negative for shortness of breath.   Hematologic/Lymphatic: Negative for bleeding problem.  Musculoskeletal:  Negative for muscle cramps and myalgias.  Neurological:  Negative for dizziness and light-headedness.    PHYSICAL EXAM:    07/20/2022    9:48 AM 06/07/2022    8:26 AM 04/13/2022    8:36 AM  Vitals with BMI  Height 5\' 1"  5\' 1"  5\' 1"   Weight 144 lbs 142 lbs 6 oz 144 lbs  BMI 27.22 [id number]  Systolic 98 123456 [id number]  Diastolic 68 73 75  Pulse  73 82 98    Physical Exam  Constitutional: No distress.  Age appropriate, hemodynamically stable.   Neck: No JVD present.  Cardiovascular: Normal rate, regular rhythm, S1 normal, S2 normal, intact distal pulses and normal pulses. Exam reveals no gallop, no S3 and no S4.   No murmur heard. Pulmonary/Chest: Effort normal and breath sounds normal. No stridor. She has no wheezes. She has no rales.  Abdominal: Soft. Bowel sounds are normal. She exhibits no distension. There is no abdominal tenderness.  Musculoskeletal:        General: No edema.     Cervical back: Neck supple.  Neurological: She is alert and oriented to person, place, and time. She has intact cranial nerves (2-12).  Skin: Skin is warm and moist.   CARDIAC DATABASE: EKG: 06/07/2022: Sinus Rhythm, LAE, normal axis, without underlying injury pattern.   Echocardiogram: 06/16/2022: Normal LV systolic function with visual EF 60-65%. Left ventricle cavity is normal in size. Normal left ventricular wall thickness. Normal global wall motion. Normal diastolic filling pattern, normal LAP. No significant valvular abnormalities are seen. No prior study for comparison.    Stress Testing: No results found for this or any previous visit from the past 1095 days.  Heart Catheterization: None  CT Cardiac Scoring: 06/22/2022 performed at [facility] health Total CAC 0 AU.  Noncardiac findings: Probable small anterior left hepatic cyst, partially visualized   LABORATORY DATA:    Latest Ref Rng & Units 04/13/2022    9:40 AM 12/17/2021    8:59 AM 10/13/2021    4:43 PM  CBC  WBC 3.4 - 10.8 x10E3/uL 7.2  10.9  8.5   Hemoglobin 11.1 - 15.9 g/dL 13.8  13.3  12.4   Hematocrit 34.0 - 46.6 % 41.7  41.0  38.0   Platelets 150 - 450 x10E3/uL 258  290  329        Latest Ref Rng & Units 04/13/2022    9:40 AM 12/17/2021    8:59 AM 10/13/2021    4:43 PM  CMP  Glucose 70 - 99 mg/dL 72  63  85   BUN 6 - 24 mg/dL 8  9  7    Creatinine 0.57 - 1.00 mg/dL 0.63  0.56  0.45   Sodium 134 - 144 mmol/L 141  142  144   Potassium 3.5 - 5.2 mmol/L 4.0  3.9  4.3   Chloride 96 - 106 mmol/L 105  105  104   CO2 20 - 29 mmol/L 24  22  25    Calcium 8.7 - 10.2 mg/dL 9.4  9.0  9.7   Total Protein 6.0 - 8.5 g/dL 6.5  6.3  7.2   Total  Bilirubin 0.0 - 1.2 mg/dL <0.2  0.3  <0.2   Alkaline Phos 44 - 121 IU/L 59  62  72   AST 0 - 40 IU/L 17  12  13    ALT 0 - 32 IU/L 20  18  13      Lipid Panel     Component Value Date/Time   CHOL 281 (H) 12/17/2021 0859   TRIG 170 (H) 12/17/2021 0859   HDL 80 12/17/2021 0859   CHOLHDL 3.5 12/17/2021 0859   LDLCALC 171 (H) 12/17/2021 0859   LABVLDL 30 12/17/2021 0859    No components found for: "NTPROBNP" No results for input(s): "PROBNP" in the last 8760 hours. No results for input(s): "TSH" in the last 8760 hours.  BMP Recent Labs    10/13/21 1643 12/17/21 0859 04/13/22  0940  NA 144 142 141  K 4.3 3.9 4.0  CL 104 105 105  CO2 25 22 24   GLUCOSE 85 63* 72  BUN 7 9 8   CREATININE 0.45* 0.56* 0.63  CALCIUM 9.7 9.0 9.4    HEMOGLOBIN A1C No results found for: "HGBA1C", "MPG"  External Labs: Collected: June 11, 2021 labs available [facility]. BUN 9, creatinine 0.51. AST 44, ALT 75 (above normal limits). TSH 1.5. Total cholesterol 266, triglycerides 174, HDL 61, LDL 173  LIPIDS: 07/22/2020 Total cholesterol 233, triglycerides 158, HDL 55, LDL 150 06/11/2021  Total cholesterol 266, triglycerides 174, HDL 61, LDL 173 12/17/2021: Total cholesterol 281, triglycerides 170, HDL 80, LDL calculated 171  IMPRESSION:    ICD-10-CM   1. Dyspnea on exertion  R06.09     2. Family history of heart disease  Z82.49 PCV CARDIAC STRESS TEST    3. Pure hypercholesterolemia  E78.00 PCV CARDIAC STRESS TEST    4. Hypertriglyceridemia  E78.1 PCV CARDIAC STRESS TEST    5. Myelin oligodendrocyte glycoprotein antibody disorder (MOGAD)  G37.81        RECOMMENDATIONS: HUDSON POOT is a 42 y.o. [place] descent female whose past medical history and cardiac risk factors include:  myelin oligodendrocyte glycoprotein antibody disease, family history of heart disease, Hyperlipidemia.   Dyspnea on exertion Resolved since last office visit. Denies anginal chest pain or heart failure  symptoms. Lower extremity swelling has also resolved since last visit-mostly positional and better in the mornings. Echocardiogram notes preserved LVEF without any significant valvular heart disease.  Family history of heart disease Pure hypercholesterolemia Hypertriglyceridemia Noted to have hyperlipidemia as early as February 2022-do not have other labs prior to that for reference/review. To the best of her knowledge she started Actemra in August 2023. She will have repeat labs with PCP in the coming weeks. Coronary calcium score is 0. Patient has made lifestyle changes with regards to dietary changes and increasing physical activity as tolerated since last visit. Given her uncontrolled lipids and family history of heart disease recommend GXT to evaluate for functional capacity and exercise-induced arrhythmia/ischemia. If despite lifestyle changes her lipids remain uncontrolled shared decision will need to be made w/ regards to initiation of medications. She will send me a copy for reference/review. Patient and husband verbalize understanding.  Further recommendations to follow  Myelin oligodendrocyte glycoprotein antibody disorder (MOGAD) Currently on [place] neurology.  Patient plans to have her lipids checked with PCP in the coming weeks. Patient is also informed of the incidental finding of a possible liver cyst on her recent coronary calcium score.  Patient states that she will review it further with her neurologist and/or PCP for further guidance.  FINAL MEDICATION LIST END OF ENCOUNTER: No orders of the defined types were placed in this encounter.   There are no discontinued medications.    Current Outpatient Medications:    ACTEMRA ACTPEN 162 MG/0.9ML SOAJ, Inject 162 mg into the skin once a week., Disp: 10.8 mL, Rfl: 3   OXcarbazepine (TRILEPTAL) 150 MG tablet, TAKE ONE TABLET BY MOUTH EVERY MORNING AND TAKE TWO TABLETS BY MOUTH EVERY NIGHT AT BEDTIME, Disp: 90  tablet, Rfl: 3   furosemide (LASIX) 20 MG tablet, Take 20 mg by mouth daily as needed. (Patient not taking: Reported on 07/20/2022), Disp: , Rfl:    Potassium Chloride CR (MICRO-K) 8 MEQ CPCR capsule CR, Take 8 mEq by mouth once. (Patient not taking: Reported on 07/20/2022), Disp: , Rfl:   Orders  Placed This Encounter  Procedures   PCV CARDIAC STRESS TEST    There are no Patient Instructions on file for this visit.   --Continue cardiac medications as reconciled in final medication list. --Return in about 3 months (around 10/20/2022) for Follow up lipids and review stress test. . or sooner if needed. --Continue follow-up with your primary care physician regarding the management of your other chronic comorbid conditions.  Patient's questions and concerns were addressed to her satisfaction. She voices understanding of the instructions provided during this encounter.   This note was created using a voice recognition software as a result there may be grammatical errors inadvertently enclosed that do not reflect the nature of this encounter. Every attempt is made to correct such errors.  Rex Kras, [place], [facility]  Pager:  [phone] Office: [phone]

## 2022-08-04 ENCOUNTER — Encounter: Payer: Self-pay | Admitting: Neurology

## 2022-08-05 NOTE — Telephone Encounter (Signed)
Please see the MyChart message reply(ies) for my assessment and plan.    This patient gave consent for this Medical Advice Message and is aware that it may result in a bill to [employer], as well as the possibility of receiving a bill for a co-payment or deductible. They are an established patient, but are not seeking medical advice exclusively about a problem treated during an in person or video visit in the last seven days. I did not recommend an in person or video visit within seven days of my reply.    I spent a total of 10 minutes cumulative time within 7 days through [employer].  Britt Bottom, MD

## 2022-08-06 ENCOUNTER — Encounter: Payer: Self-pay | Admitting: Cardiology

## 2022-08-15 NOTE — Progress Notes (Signed)
External Labs: Collected: 07/22/2022 provided by referring physician Hemoglobin 12.8, hematocrit 35.7% BUN 9, creatinine 0.46. eGFR >60. BUN and creatinine ratio 20. Sodium 138, potassium 4.6, chloride 104, bicarb 21. AST 25, ALT 37, alkaline phosphatase 65 (ALT slightly above normal limits) A1c 5.5 TSH 1.5  Medical assistant: Please inform the patient that I did review outside labs.  However they did not include lipid panel.  If this was performed please have it forwarded to the office for further reference  Tessa Lerner, DO, [facility]

## 2022-08-16 NOTE — Progress Notes (Signed)
Patient is aware that the Lipid panel was not drawn and already has an appointment to see PCP tomorrow to have that drawn.

## 2022-08-17 ENCOUNTER — Encounter: Payer: Self-pay | Admitting: Neurology

## 2022-08-17 ENCOUNTER — Encounter: Payer: Self-pay | Admitting: *Deleted

## 2022-08-17 ENCOUNTER — Ambulatory Visit: Payer: 59 | Admitting: Neurology

## 2022-08-17 VITALS — BP 99/66 | HR 78 | Ht 62.0 in | Wt 139.0 lb

## 2022-08-17 DIAGNOSIS — G3781 Myelin oligodendrocyte glycoprotein antibody disease: Secondary | ICD-10-CM

## 2022-08-17 DIAGNOSIS — M79642 Pain in left hand: Secondary | ICD-10-CM

## 2022-08-17 DIAGNOSIS — M79641 Pain in right hand: Secondary | ICD-10-CM | POA: Diagnosis not present

## 2022-08-17 DIAGNOSIS — Z79899 Other long term (current) drug therapy: Secondary | ICD-10-CM

## 2022-08-17 MED ORDER — HYDROCHLOROTHIAZIDE 12.5 MG PO CAPS: 12.5000 mg | ORAL_CAPSULE | Freq: Every day | ORAL | 3 refills | Status: DC
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
## 2022-08-18 LAB — COMPREHENSIVE METABOLIC PANEL
ALT: 20 IU/L (ref 0–32)
AST: 19 IU/L (ref 0–40)
Albumin/Globulin Ratio: 2.3 — ABNORMAL HIGH (ref 1.2–2.2)
Albumin: 4.6 g/dL (ref 3.9–4.9)
Alkaline Phosphatase: 63 IU/L (ref 44–121)
BUN/Creatinine Ratio: 13 (ref 9–23)
BUN: 8 mg/dL (ref 6–24)
Bilirubin Total: 0.5 mg/dL (ref 0.0–1.2)
CO2: 20 mmol/L (ref 20–29)
Calcium: 9.2 mg/dL (ref 8.7–10.2)
Chloride: 103 mmol/L (ref 96–106)
Creatinine, Ser: 0.6 mg/dL (ref 0.57–1.00)
Globulin, Total: 2 g/dL (ref 1.5–4.5)
Glucose: 89 mg/dL (ref 70–99)
Potassium: 4.4 mmol/L (ref 3.5–5.2)
Sodium: 141 mmol/L (ref 134–144)
Total Protein: 6.6 g/dL (ref 6.0–8.5)
eGFR: 115 mL/min/{1.73_m2} (ref >59)

## 2022-08-18 LAB — CBC WITH DIFFERENTIAL/PLATELET
Basophils Absolute: 0 10*3/uL (ref 0.0–0.2)
Basos: 1 %
EOS (ABSOLUTE): 0.1 10*3/uL (ref 0.0–0.4)
Eos: 1 %
Hematocrit: 39 % (ref 34.0–46.6)
Hemoglobin: 12.9 g/dL (ref 11.1–15.9)
Immature Grans (Abs): 0 10*3/uL (ref 0.0–0.1)
Immature Granulocytes: 0 %
Lymphocytes Absolute: 2.5 10*3/uL (ref 0.7–3.1)
Lymphs: 47 %
MCH: 31.5 pg (ref 26.6–33.0)
MCHC: 33.1 g/dL (ref 31.5–35.7)
MCV: 95 fL (ref 79–97)
Monocytes Absolute: 0.4 10*3/uL (ref 0.1–0.9)
Monocytes: 9 %
Neutrophils Absolute: 2.2 10*3/uL (ref 1.4–7.0)
Neutrophils: 42 %
Platelets: 202 10*3/uL (ref 150–450)
RBC: 4.09 x10E6/uL (ref 3.77–5.28)
RDW: 12.8 % (ref 11.7–15.4)
WBC: 5.2 10*3/uL (ref 3.4–10.8)

## 2022-08-18 LAB — LIPID PANEL
Chol/HDL Ratio: 5.4 ratio — ABNORMAL HIGH (ref 0.0–4.4)
Cholesterol, Total: 284 mg/dL — ABNORMAL HIGH (ref 100–199)
HDL: 53 mg/dL (ref >39)
LDL Chol Calc (NIH): 198 mg/dL — ABNORMAL HIGH (ref 0–99)
Triglycerides: 178 mg/dL — ABNORMAL HIGH (ref 0–149)
VLDL Cholesterol Cal: 33 mg/dL (ref 5–40)

## 2022-08-26 ENCOUNTER — Ambulatory Visit: Payer: 59

## 2022-08-26 DIAGNOSIS — R0609 Other forms of dyspnea: Secondary | ICD-10-CM | POA: Diagnosis not present

## 2022-08-26 DIAGNOSIS — E7800 Pure hypercholesterolemia, unspecified: Secondary | ICD-10-CM

## 2022-08-26 DIAGNOSIS — E781 Pure hyperglyceridemia: Secondary | ICD-10-CM | POA: Diagnosis not present

## 2022-08-26 DIAGNOSIS — Z8249 Family history of ischemic heart disease and other diseases of the circulatory system: Secondary | ICD-10-CM | POA: Diagnosis not present

## 2022-08-26 NOTE — Telephone Encounter (Signed)
Reviewed pt chart. Last rx e-scribed to [facility] for 90 days supply and 3 refills on 02/02/22. They should have refills to last through 02/2023. I called pharmacy and spoke w/ Chanel. Pt receives from [place] pt assistance program. Phone# [phone].  She transferred me to them. I spoke w/ pharmacist, Tammy Sours. Confirmed they have one refill left on file for 90 days supply. Last dispensed rx 07/22/22 and it was shipped 07/26/22. She is not due for refill until around June which she will get another 90 days supply. They do not need new rx called in yet, it is too soon.

## 2022-08-26 NOTE — Telephone Encounter (Signed)
Would hold ordering elbow xray for now.

## 2022-10-15 ENCOUNTER — Ambulatory Visit: Payer: 59 | Admitting: Cardiology

## 2022-12-01 ENCOUNTER — Telehealth: Payer: Self-pay | Admitting: *Deleted

## 2022-12-01 NOTE — Telephone Encounter (Signed)
Called pt and she relayed had insurance change to [employer].  We received fax for the [facility] from [facility].  Need PA  to see if approved or denied.  Pt stated she had coverage until end of year from last denial [employer].  I spoke to [employer] [facility] [employer] and once PA done to fax this result to them.   Contact plan in 5 days to follow up on [id number].

## 2022-12-14 NOTE — Telephone Encounter (Signed)
GENENTECH (Lorie) calling to check if PA ACTEMRA has been approved. Phone: [phone] ext [phone].

## 2022-12-15 ENCOUNTER — Other Ambulatory Visit (HOSPITAL_COMMUNITY): Payer: Self-pay

## 2022-12-15 ENCOUNTER — Telehealth: Payer: Self-pay

## 2022-12-15 NOTE — Telephone Encounter (Signed)
Pharmacy Patient Advocate Encounter   Received notification from Physician's Office that prior authorization for Actemra ACTPen 162MG /0.9ML auto-injectors is required/requested.   Insurance verification completed.   The patient is insured through [facility] .   Per test claim: PA required; PA submitted to [facility] via CoverMyMeds Key/confirmation #/EOC [id number] Status is pending

## 2022-12-15 NOTE — Telephone Encounter (Signed)
PA request has been Submitted. New Encounter created for follow up. For additional info see Pharmacy Prior Auth telephone encounter from 12/15/2022.

## 2022-12-17 NOTE — Telephone Encounter (Signed)
Pharmacy Patient Advocate Encounter  Received notification from [facility] that Prior Authorization for Actemra ACTPen 162MG /0.9ML auto-injectors has been DENIED. Please advise how you'd like to proceed. Full denial letter will be uploaded to the media tab. See denial reason below.  Your plan only covers this drug when it's used for certain health conditions. Covered uses are rheumatoid arthritis, oligoarticular juvenile idiopathic arthritis, polyarticular juvenile idiopathic arthritis, systemic juvenile idiopathic arthritis, giant cell arteritis, systemic sclerosis-associated interstitial lung disease, unicentric castleman disease, multicentric castleman disease, immune checkpoint inhibitor-related toxicity, cytokine release syndrome, or acute graft versus host disease.   PA #/Case ID/Reference #: [id number]  Please be advised we currently do not have a Pharmacist to review denials, therefore you will need to process appeals accordingly as needed. Thanks for your support at this time. Contact for appeals are as follows: Phone: [phone], Fax: [phone]

## 2022-12-21 NOTE — Telephone Encounter (Signed)
Appeal letter created and placed in MD basket for signature.

## 2022-12-21 NOTE — Telephone Encounter (Signed)
Letter signed and faxed to appeals [phone], confirmation received.

## 2022-12-30 ENCOUNTER — Encounter: Payer: Self-pay | Admitting: Neurology

## 2022-12-30 ENCOUNTER — Other Ambulatory Visit: Payer: Self-pay

## 2022-12-30 MED ORDER — ACTEMRA ACTPEN 162 MG/0.9ML ~~LOC~~ SOAJ: 162.0000 mg | SUBCUTANEOUS | 3 refills | Status: DC

## 2023-01-05 ENCOUNTER — Other Ambulatory Visit: Payer: Self-pay | Admitting: Neurology

## 2023-01-05 ENCOUNTER — Telehealth: Payer: Self-pay | Admitting: Neurology

## 2023-01-05 DIAGNOSIS — G379 Demyelinating disease of central nervous system, unspecified: Secondary | ICD-10-CM

## 2023-01-05 DIAGNOSIS — G3781 Myelin oligodendrocyte glycoprotein antibody disease: Secondary | ICD-10-CM

## 2023-01-05 NOTE — Telephone Encounter (Signed)
sent to GI they obtain [employer] auth [phone] 

## 2023-01-24 ENCOUNTER — Encounter: Payer: Self-pay | Admitting: Neurology

## 2023-01-25 ENCOUNTER — Encounter: Payer: Self-pay | Admitting: Neurology

## 2023-01-26 ENCOUNTER — Ambulatory Visit
Admission: RE | Admit: 2023-01-26 | Discharge: 2023-01-26 | Disposition: A | Discharge status: Home / Self Care | Payer: 59 | Source: Ambulatory Visit | Attending: Neurology | Admitting: Neurology

## 2023-01-26 DIAGNOSIS — G379 Demyelinating disease of central nervous system, unspecified: Secondary | ICD-10-CM

## 2023-01-26 DIAGNOSIS — G3781 Myelin oligodendrocyte glycoprotein antibody disease: Secondary | ICD-10-CM

## 2023-01-26 MED ORDER — GADOPICLENOL 0.5 MMOL/ML IV SOLN
6.0000 mL | Freq: Once | INTRAVENOUS | Status: AC | PRN
  Administered 2023-01-26: 6 mL via INTRAVENOUS

## 2023-01-26 NOTE — Telephone Encounter (Signed)
I called [employer] re: follow up on appeal for the Actemra.  Appeal was approved 01-19-2023, notified pharmacy.  [facility]  CASE # [id number], ref # for this call [id number].  Called provider services # [phone].  I called [employer] access [employer] spoke to [place], with [facility] [phone].  Pt is active and approved. Insurance confirmed.  I relayed that actemra was approved under appeal.  Last rx was sent out to patient 01-19-2023, and has 3 refills.  [facility] is the [employer].  Nothing more on our end.

## 2023-02-13 ENCOUNTER — Other Ambulatory Visit: Payer: Self-pay | Admitting: Neurology

## 2023-02-14 NOTE — Telephone Encounter (Signed)
Last seen on 08/17/22 per note pt should be taking 1 po BID. Directions changed on Rx Follow up scheduled on 02/22/23

## 2023-02-22 ENCOUNTER — Encounter: Payer: Self-pay | Admitting: Neurology

## 2023-02-22 ENCOUNTER — Ambulatory Visit: Payer: 59 | Admitting: Neurology

## 2023-02-22 VITALS — BP 96/63 | HR 61 | Ht 62.0 in | Wt 133.0 lb

## 2023-02-22 DIAGNOSIS — E559 Vitamin D deficiency, unspecified: Secondary | ICD-10-CM

## 2023-02-22 DIAGNOSIS — Z79899 Other long term (current) drug therapy: Secondary | ICD-10-CM | POA: Diagnosis not present

## 2023-02-22 DIAGNOSIS — E538 Deficiency of other specified B group vitamins: Secondary | ICD-10-CM

## 2023-02-22 DIAGNOSIS — M62838 Other muscle spasm: Secondary | ICD-10-CM

## 2023-02-22 DIAGNOSIS — G3781 Myelin oligodendrocyte glycoprotein antibody disease: Secondary | ICD-10-CM | POA: Diagnosis not present
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
## 2023-05-05 LAB — LAB REPORT - SCANNED
A1c: 5.4
EGFR: 114

## 2023-05-05 IMAGING — MR MR CERVICAL SPINE WO/W CM
6 of 8 series · 30 of 48 positions shown · IV contrast (gadavist)
Comparison: Cervical spine MRI 03/18/2021

CLINICAL DATA: Multiple sclerosis, new weakness, muscle spasms of
right upper and lower extremities and right base

EXAM:
MRI CERVICAL SPINE WITHOUT AND WITH CONTRAST
TECHNIQUE: Multiplanar and multiecho pulse sequences of the cervical spine, to
include the craniocervical junction and cervicothoracic junction,
were obtained without and with intravenous contrast.
CONTRAST:  6mL GADAVIST GADOBUTROL 1 MMOL/ML IV SOLN

[Series 9: T2 · sagittal · 3.0mm · 0.69mm/px · 3 of 15 slices shown (1 of 2)]
[im 1/15]
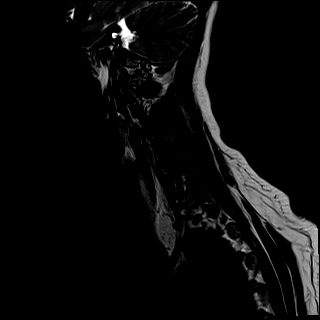
[im 8/15]
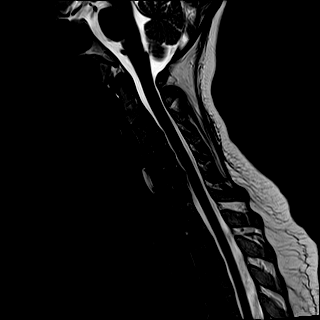
[im 15/15]
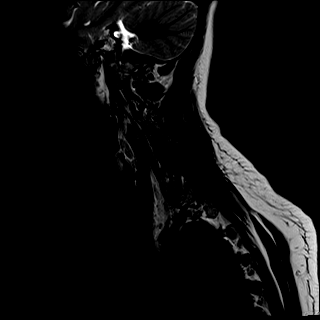

[Series 10: T1 · sagittal · 3.0mm · 0.69mm/px · 3 of 15 slices shown (1 of 2)]
[im 1/15]
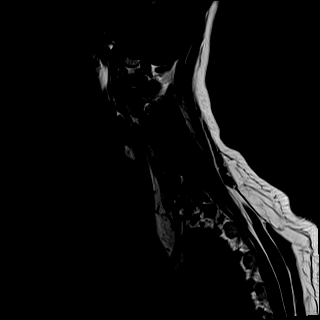
[im 8/15]
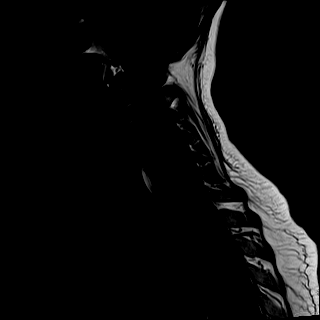
[im 15/15]
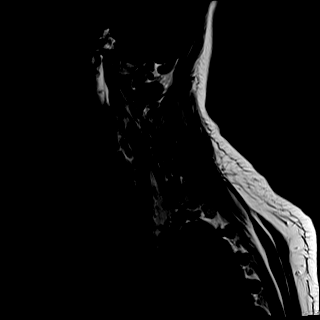

[Series 11: STIR · sagittal · 3.0mm · 0.86mm/px · 3 of 15 slices shown]
[im 1/15]
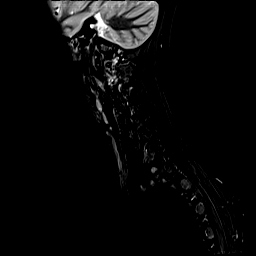
[im 8/15]
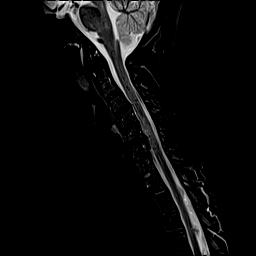
[im 15/15]
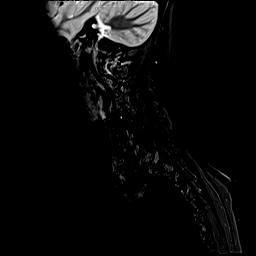

[Series 12: T2 · axial · 3.0mm · 0.66mm/px · z∈[-254,-134]mm · 9 of 40 slices shown (2 of 2)]
[im 1/40]
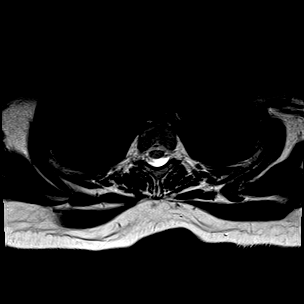
[im 5/40]
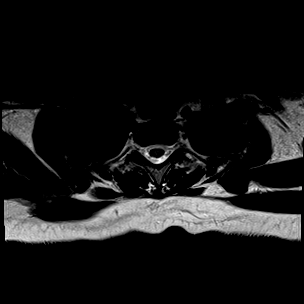
[im 10/40]
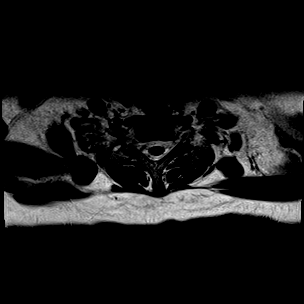
[im 15/40]
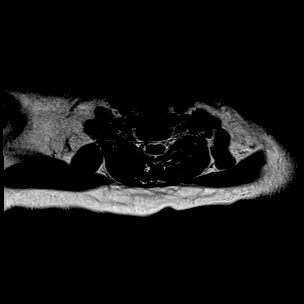
[im 20/40]
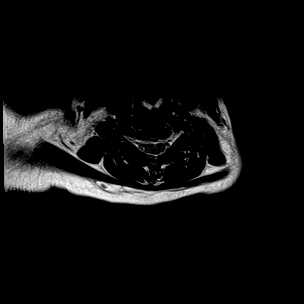
[im 25/40]
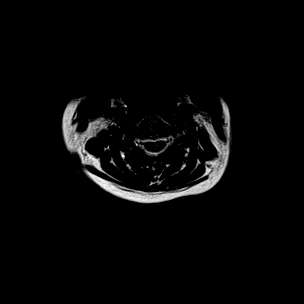
[im 30/40]
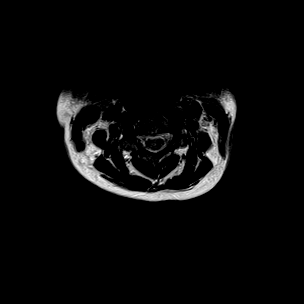
[im 35/40]
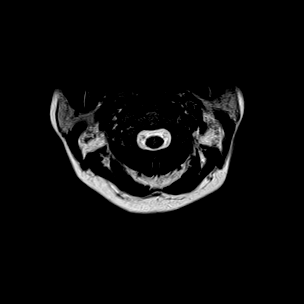
[im 40/40]
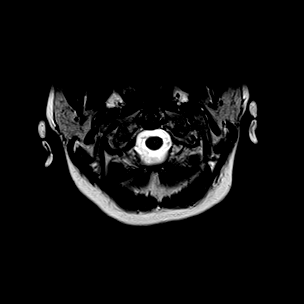

[Series 14: T1 · axial · 3.0mm · 0.39mm/px · z∈[-254,-134]mm · 9 of 40 slices shown (2 of 2)]
[im 1/40]
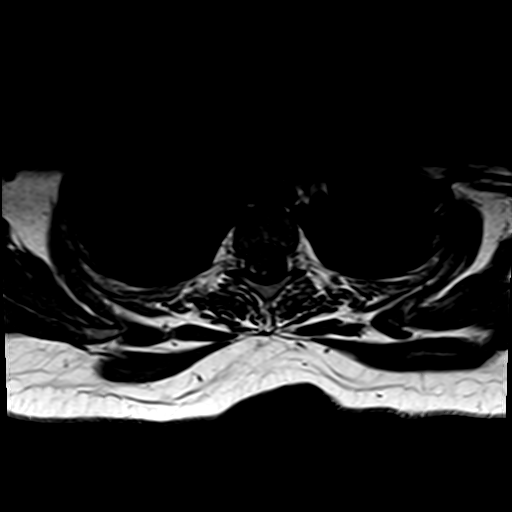
[im 5/40]
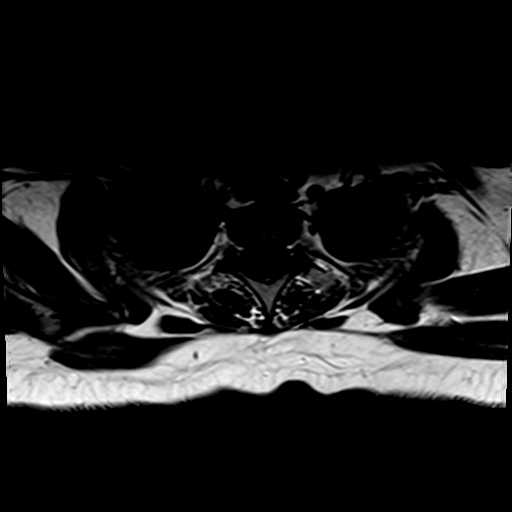
[im 10/40]
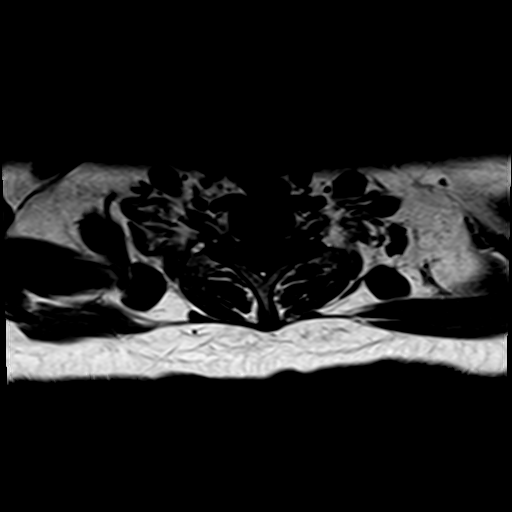
[im 15/40]
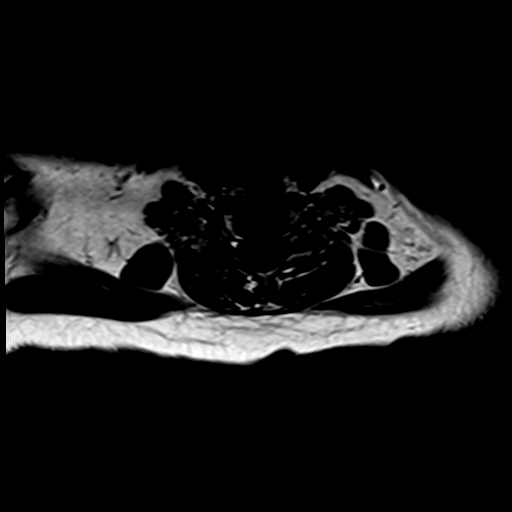
[im 20/40]
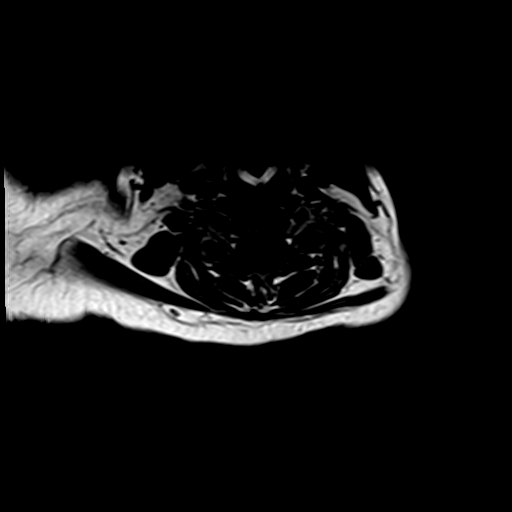
[im 25/40]
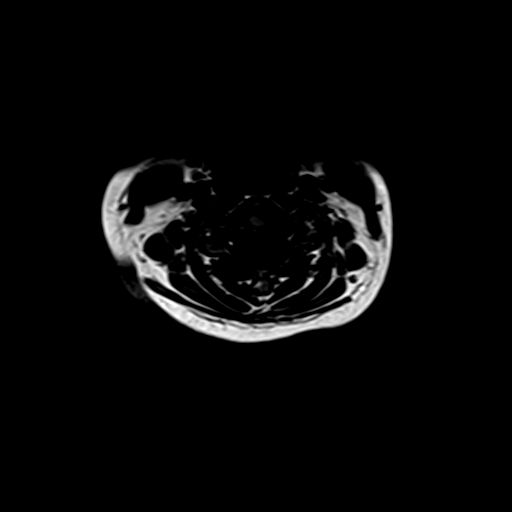
[im 30/40]
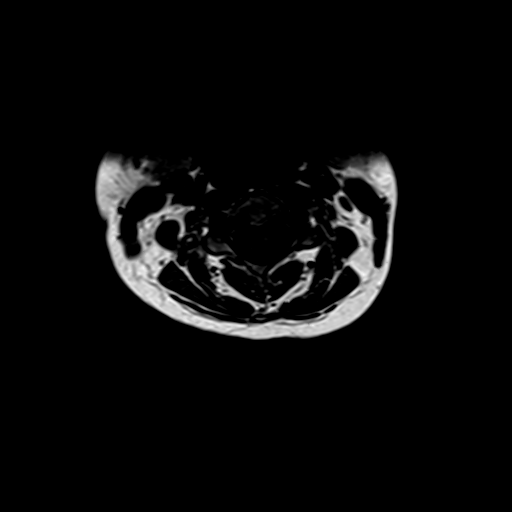
[im 35/40]
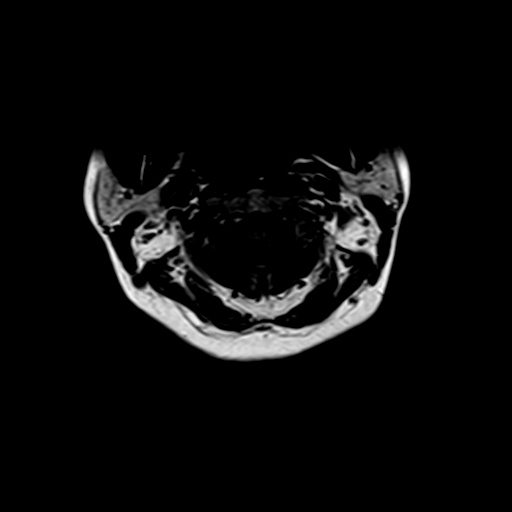
[im 40/40]
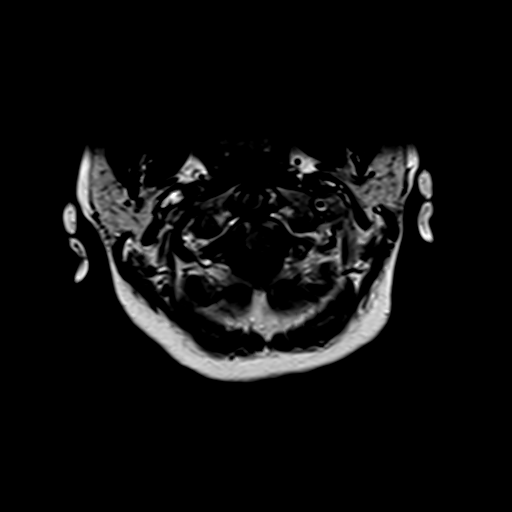

[Series 15: T1 fat-sat post-contrast · sagittal · 3.0mm · 0.43mm/px · 3 of 15 slices shown]
[im 1/15]
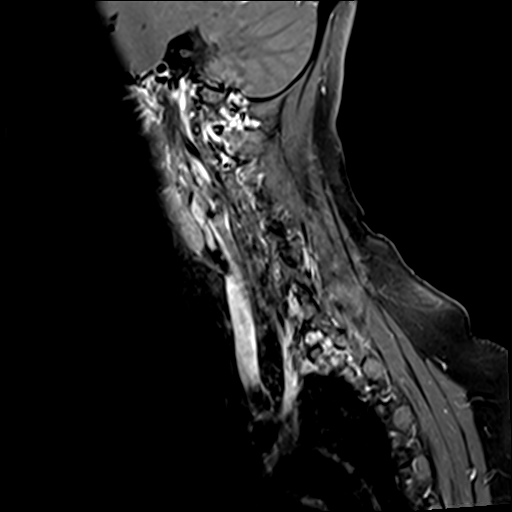
[im 8/15]
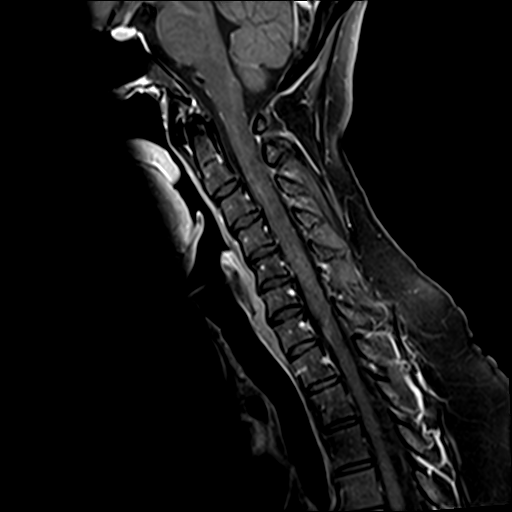
[im 15/15]
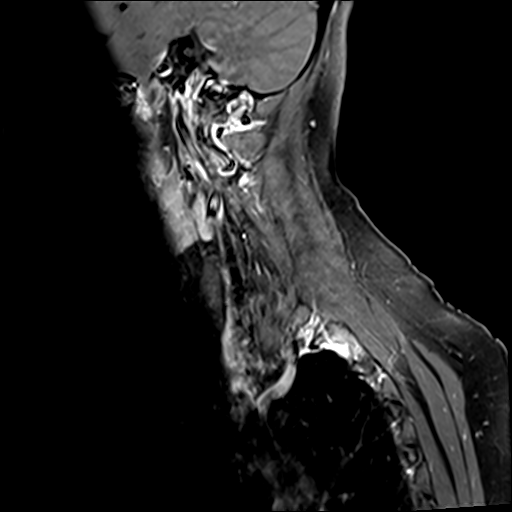

[30 of 48 positions shown; findings below may reference images not displayed]

FINDINGS: Alignment: There is slight reversal of the normal cervical spine
lordosis. There is no antero or retrolisthesis.

Vertebrae: Vertebral body heights are preserved. There is no marrow
signal abnormality. There is no abnormal marrow enhancement.

Cord: There is no convincing evidence of cord signal abnormality.
There is no abnormal cord enhancement.

Posterior Fossa, vertebral arteries, paraspinal tissues: The
posterior fossa is assessed on the separately dictated brain MRI.
The vertebral artery flow voids are present. The paraspinal soft
tissues are unremarkable.

Disc levels:

There is mild degenerative change in the cervical spine, most
advanced at C5-C6, unchanged since the recent cervical spine MRI.
IMPRESSION: No convincing evidence of cord signal abnormality. No abnormal
enhancement to suggest active demyelination.

## 2023-05-06 ENCOUNTER — Encounter: Payer: Self-pay | Admitting: Neurology

## 2023-05-31 ENCOUNTER — Encounter: Payer: Self-pay | Admitting: Cardiology

## 2023-06-24 ENCOUNTER — Encounter: Payer: Self-pay | Admitting: Neurology

## 2023-06-27 ENCOUNTER — Telehealth: Payer: Self-pay | Admitting: *Deleted

## 2023-06-27 NOTE — Telephone Encounter (Signed)
 PA Actemra needed.  Pt only has 3 injections left.

## 2023-06-28 ENCOUNTER — Telehealth: Payer: Self-pay

## 2023-06-28 ENCOUNTER — Other Ambulatory Visit (HOSPITAL_COMMUNITY): Payer: Self-pay

## 2023-06-28 NOTE — Telephone Encounter (Signed)
 Pharmacy Patient Advocate Encounter   Received notification from Pt Calls Messages that prior authorization for Actemra ACTPen 162MG /0.9ML auto-injectors is required/requested.   Insurance verification completed.   The patient is insured through  [employer]  .   Per test claim: PA required; PA submitted to above mentioned insurance via CoverMyMeds Key/confirmation #/EOC [id number] Status is pending

## 2023-06-29 NOTE — Telephone Encounter (Signed)
 Checked status of PA of covermymeds. "Approved. ACTEMRA ACTPEN 162MG /0.9ML Soln Auto-inj is approved from 06/28/2023 to 06/27/2024. All strengths of the drug are approved."  Faxed updated insurance and forms below to [place] at [phone]. Received fax confirmation. Sent update to pt via mychart.

## 2023-07-04 NOTE — Telephone Encounter (Signed)
 PA Approval Actemra subcutaneous  Approval date 07/01/2023

## 2023-07-06 NOTE — Telephone Encounter (Signed)
 Called [facility] back at [phone]. Spoke w/ [facility]. Verified pt address/phone#. She transferred me to pharmacist, Oswaldo Done. Provided VO for Actemra Actpen 162mg , qty 10.59ml (90days supply) w/ 2 refills. He verbalized understanding, nothing further needed.  Total time of call: 16 min

## 2023-07-06 NOTE — Telephone Encounter (Signed)
 [facility]) Received the Prior Authorization but did not get a prescription; requesting prescription.  Contact info: [phone]

## 2023-08-06 ENCOUNTER — Encounter: Payer: Self-pay | Admitting: Neurology

## 2023-09-08 ENCOUNTER — Ambulatory Visit: Payer: 59 | Admitting: Neurology

## 2023-09-08 ENCOUNTER — Encounter: Payer: Self-pay | Admitting: Neurology

## 2023-09-08 VITALS — BP 102/69 | HR 78 | Ht 61.0 in | Wt 139.0 lb

## 2023-09-08 DIAGNOSIS — E559 Vitamin D deficiency, unspecified: Secondary | ICD-10-CM

## 2023-09-08 DIAGNOSIS — G3781 Myelin oligodendrocyte glycoprotein antibody disease: Secondary | ICD-10-CM

## 2023-09-08 DIAGNOSIS — M62838 Other muscle spasm: Secondary | ICD-10-CM | POA: Diagnosis not present

## 2023-09-08 DIAGNOSIS — Z79899 Other long term (current) drug therapy: Secondary | ICD-10-CM
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
## 2023-09-13 ENCOUNTER — Encounter: Payer: Self-pay | Admitting: Neurology

## 2023-12-15 ENCOUNTER — Other Ambulatory Visit (HOSPITAL_COMMUNITY): Payer: Self-pay | Admitting: Internal Medicine

## 2023-12-15 DIAGNOSIS — N926 Irregular menstruation, unspecified: Secondary | ICD-10-CM

## 2023-12-19 ENCOUNTER — Encounter (HOSPITAL_BASED_OUTPATIENT_CLINIC_OR_DEPARTMENT_OTHER): Payer: Self-pay

## 2023-12-19 ENCOUNTER — Ambulatory Visit (HOSPITAL_BASED_OUTPATIENT_CLINIC_OR_DEPARTMENT_OTHER): Admission: RE | Admit: 2023-12-19 | Source: Ambulatory Visit

## 2024-01-18 ENCOUNTER — Encounter: Payer: Self-pay | Admitting: Neurology

## 2024-01-24 ENCOUNTER — Other Ambulatory Visit: Payer: Self-pay | Admitting: *Deleted

## 2024-01-24 ENCOUNTER — Ambulatory Visit: Attending: Cardiology | Admitting: Cardiology

## 2024-01-24 ENCOUNTER — Encounter: Payer: Self-pay | Admitting: Cardiology

## 2024-01-24 VITALS — BP 102/62 | HR 88 | Resp 16 | Ht 61.0 in | Wt 142.0 lb

## 2024-01-24 DIAGNOSIS — R6889 Other general symptoms and signs: Secondary | ICD-10-CM | POA: Diagnosis not present

## 2024-01-24 DIAGNOSIS — E781 Pure hyperglyceridemia: Secondary | ICD-10-CM | POA: Diagnosis not present

## 2024-01-24 DIAGNOSIS — E7800 Pure hypercholesterolemia, unspecified: Secondary | ICD-10-CM

## 2024-01-24 DIAGNOSIS — Z8249 Family history of ischemic heart disease and other diseases of the circulatory system: Secondary | ICD-10-CM | POA: Diagnosis not present

## 2024-01-24 MED ORDER — ROSUVASTATIN CALCIUM 10 MG PO TABS
10.0000 mg | ORAL_TABLET | Freq: Every day | ORAL | 3 refills | Status: AC
Start: 2024-01-24 — End: ?

## 2024-01-24 NOTE — Progress Notes (Signed)
 Cardiology Office Note:  .   Date:  01/24/2024  ID:  [name], DOB Feb 04, 1981, MRN [id number] PCP:  Gable Cambric, MD  Former Cardiology Providers: NA [facility] Providers Cardiologist:  Madonna Large, DO , [facility] (established care 06/07/2022) Electrophysiologist:  None  Click to update primary MD,subspecialty MD or APP then REFRESH:1}    Chief Complaint  Patient presents with   Follow-up    Lightheadedness     History of Present Illness: .   Janice Rose Janice Rose is a 43 y.o. [place] descent female whose past medical history and cardiovascular risk factors includes:  myelin oligodendrocyte glycoprotein antibody disease, family history of heart disease, Hyperlipidemia.   Patient was referred to the practice for possible CAD given her strong family history after being started on Actemra  for myelin oligodendrocyte glycoprotein antibody disease.  Given her family history, pure hypercholesterolemia, hypertriglyceridemia shared decision was to proceed with coronary calcium  score for further risk stratification.  Her total CAC was 0.  She wanted to focus on lifestyle modifications prior to initiating pharmacological therapy.  In addition, she did undergo echocardiogram which noted preserved LVEF, normal diastolic function and no significant valvular heart disease.  Interval history: Presents to the office for evaluation of intermittent lightheadedness noticed with normal physical exertion. Patient states that she usually goes out for a walk and a portion of that is uphill.  However since 01/08/2024 patient endorses lightheadedness as she walks up the hill otherwise she is in her normal state of health.  No exertional chest pain or shortness of breath. The lightheaded and dizziness is intermittent and self-limited. No near-syncope or syncopal events. She followed up with her PCP who recommended not to overexert and increase hydration.  She has increased salt in her diet as well without any  significant change. Patient thinks that she may be going into menopause as her periods are now irregular.  However, the last period was prolonged for approximately 1 month.  She did follow-up with labs which noted stable hemoglobin and electrolytes and TSH levels. She has an appointment with her neurologist coming up given the symptoms and her MOGAD.   Father had his first coronary intervention after the age of 64 later followed by surgical vascularization. Mother, and both sisters have cardiovascular risk factors such as hypertension and hyperlipidemia.   Review of Systems: .   Review of Systems  Cardiovascular:  Negative for chest pain, claudication, irregular heartbeat, leg swelling, near-syncope, orthopnea, palpitations, paroxysmal nocturnal dyspnea and syncope.  Respiratory:  Negative for shortness of breath.   Hematologic/Lymphatic: Negative for bleeding problem.  Neurological:  Positive for light-headedness.    Studies Reviewed:   EKG: EKG Interpretation Date/Time:  Tuesday January 24 2024 91:73:77 EDT Ventricular Rate:  79 PR Interval:  114 QRS Duration:  86 QT Interval:  388 QTC Calculation: 444 R Axis:   74  Text Interpretation: Normal sinus rhythm Normal ECG When compared with ECG of 22-Apr-2021 16:25, Since last tracing rate slower Confirmed by Large Madonna [phone]) on 01/24/2024 8:34:16 AM  Echocardiogram: 06/16/2022: Normal LV systolic function with visual EF 60-65%. Left ventricle cavity is normal in size. Normal left ventricular wall thickness. Normal global wall motion. Normal diastolic filling pattern, normal LAP. No significant valvular abnormalities are seen. No prior study for comparison.    CT Cardiac Scoring: 06/22/2022 performed at [facility] health Total CAC 0 AU.  Noncardiac findings: Probable small anterior left hepatic cyst, partially visualized   RADIOLOGY: NA  Risk Assessment/Calculations:   NA  Labs:       Latest Ref Rng & Units 08/17/2022    11:11 AM 04/13/2022    9:40 AM 12/17/2021    8:59 AM  CBC  WBC 3.4 - 10.8 x10E3/uL 5.2  7.2  10.9   Hemoglobin 11.1 - 15.9 g/dL 87.0  86.1  86.6   Hematocrit 34.0 - 46.6 % 39.0  41.7  41.0   Platelets 150 - 450 x10E3/uL 202  258  290        Latest Ref Rng & Units 08/17/2022   11:11 AM 04/13/2022    9:40 AM 12/17/2021    8:59 AM  BMP  Glucose 70 - 99 mg/dL 89  72  63   BUN 6 - 24 mg/dL 8  8  9    Creatinine 0.57 - 1.00 mg/dL 9.39  9.36  9.43   BUN/Creat Ratio 9 - 23 13  13  16    Sodium 134 - 144 mmol/L 141  141  142   Potassium 3.5 - 5.2 mmol/L 4.4  4.0  3.9   Chloride 96 - 106 mmol/L 103  105  105   CO2 20 - 29 mmol/L 20  24  22    Calcium  8.7 - 10.2 mg/dL 9.2  9.4  9.0       Latest Ref Rng & Units 08/17/2022   11:11 AM 04/13/2022    9:40 AM 12/17/2021    8:59 AM  CMP  Glucose 70 - 99 mg/dL 89  72  63   BUN 6 - 24 mg/dL 8  8  9    Creatinine 0.57 - 1.00 mg/dL 9.39  9.36  9.43   Sodium 134 - 144 mmol/L 141  141  142   Potassium 3.5 - 5.2 mmol/L 4.4  4.0  3.9   Chloride 96 - 106 mmol/L 103  105  105   CO2 20 - 29 mmol/L 20  24  22    Calcium  8.7 - 10.2 mg/dL 9.2  9.4  9.0   Total Protein 6.0 - 8.5 g/dL 6.6  6.5  6.3   Total Bilirubin 0.0 - 1.2 mg/dL 0.5  <9.7  0.3   Alkaline Phos 44 - 121 IU/L 63  59  62   AST 0 - 40 IU/L 19  17  12    ALT 0 - 32 IU/L 20  20  18      Lab Results  Component Value Date   CHOL 284 (H) 08/17/2022   HDL 53 08/17/2022   LDLCALC 198 (H) 08/17/2022   TRIG 178 (H) 08/17/2022   CHOLHDL 5.4 (H) 08/17/2022   No results for input(s): LIPOA in the last 8760 hours. No components found for: NTPROBNP No results for input(s): PROBNP in the last 8760 hours. No results for input(s): TSH in the last 8760 hours.  External Labs: Collected: 07/22/2022 provided by referring physician Hemoglobin 12.8, hematocrit 35.7% BUN 9, creatinine 0.46. eGFR >60. BUN and creatinine ratio 20. Sodium 138, potassium 4.6, chloride 104, bicarb 21. AST 25, ALT 37,  alkaline phosphatase 65 (ALT slightly above normal limits) A1c 5.5 TSH 1.5   LIPIDS: 07/22/2020 Total cholesterol 233, triglycerides 158, HDL 55, LDL 150 06/11/2021  Total cholesterol 266, triglycerides 174, HDL 61, LDL 173 12/17/2021: Total cholesterol 281, triglycerides 170, HDL 80, LDL calculated 171 08/17/2022: Total cholesterol 284, triglycerides 178, HDL 53, LDL calculated 198  Physical Exam:    Today's Vitals   01/24/24 0821  BP: 102/62  Pulse: 88  Resp: 16  SpO2: 97%  Weight: 142 lb (64.4 kg)  Height: 5' 1 (1.549 m)   Body mass index is 26.83 kg/m. Wt Readings from Last 3 Encounters:  01/24/24 142 lb (64.4 kg)  09/08/23 139 lb (63 kg)  02/22/23 133 lb (60.3 kg)   Orthostatic VS for the past 72 hrs (Last 3 readings):  Orthostatic BP Patient Position BP Location Cuff Size Orthostatic Pulse  01/24/24 0829 108/76 Standing Left Arm Normal 89  01/24/24 0828 111/78 Sitting Left Arm Normal 82  01/24/24 0827 107/76 Supine Left Arm Normal 76   Physical Exam  Constitutional: No distress.  hemodynamically stable  Neck: No JVD present.  Cardiovascular: Normal rate, regular rhythm, S1 normal and S2 normal. Exam reveals no gallop, no S3 and no S4.  No murmur heard. Pulmonary/Chest: Effort normal and breath sounds normal. No stridor. She has no wheezes. She has no rales.  Musculoskeletal:        General: No edema.     Cervical back: Neck supple.  Skin: Skin is warm.   Impression & Recommendation(s):  Impression:   ICD-10-CM   1. Worsening functional endurance  R68.89 EKG 12-Lead    ECHOCARDIOGRAM COMPLETE    EXERCISE TOLERANCE TEST (ETT)    Cardiac Stress Test: Informed Consent Details: Physician/Practitioner Attestation; Transcribe to consent form and obtain patient signature    2. Family history of heart disease  Z82.49 EKG 12-Lead    3. Pure hypercholesterolemia  E78.00 Comprehensive metabolic panel with GFR    Lipid panel    rosuvastatin  (CRESTOR ) 10 MG tablet     4. Hypertriglyceridemia  E78.1 Comprehensive metabolic panel with GFR    Lipid panel       Recommendation(s):  Worsening functional endurance Endorses that her overall functional capacity is less now compared to the past No obvious anginal chest pain or heart failure symptoms EKG nonischemic Echo will be ordered to evaluate for structural heart disease and left ventricular systolic function. Exercise treadmill stress test to evaluate for functional capacity, hemodynamics with exercise, and exercise-induced ischemia/arrhythmia  Has been experiencing lightheadedness since earlier this month. Office blood pressures are soft. Orthostatic vital signs negative. Prolonged menstrual cycle over the last 1 month but follow-up labs with PCP notes hemoglobin and TSH at baseline, labs independently reviewed, provided by the patient. She has increased fluid & salt intake to help mitigate the symptoms but no significant improvement Recommend compression stockings, avoid prolonged standing, and change positions slowly She has a known neurological condition called MOGAD and is under the care of neurology, recommend follow-up with neurology to see if there is an association and its correlation w/ autonomic nervous system.   Family history of heart disease Pure hypercholesterolemia Hypertriglyceridemia Has had a long history of hypertriglyceridemia as well as hypercholesterolemia No significant improvement with lifestyle modification. 10-year risk of ASCVD is 1% and coronary calcium  score is 0 As part of a shared decision process we discussed the risks of prolonged uncontrolled lipid panel as well as risk and benefit ratio of pharmacological therapy.  Patient informs me that her neurologist already recommended lipid-lowering agents and she is agreeable to initiate therapy. Start Crestor  10 mg p.o. nightly. Check fasting lipids and CMP in 6 weeks to reevaluate therapy   Orders Placed:  Orders Placed  This Encounter  Procedures   Comprehensive metabolic panel with GFR    Standing Status:   Future    Number of Occurrences:   1    Expected Date:   04/24/2024    Expiration Date:  01/23/2025   Lipid panel    Standing Status:   Future    Number of Occurrences:   1    Expected Date:   04/24/2024    Expiration Date:   01/23/2025   Cardiac Stress Test: Informed Consent Details: Physician/Practitioner Attestation; Transcribe to consent form and obtain patient signature    Physician/Practitioner attestation of informed consent for procedure/surgical case:   I, the physician/practitioner, attest that I have discussed with the patient the benefits, risks, side effects, alternatives, likelihood of achieving goals and potential problems during recovery for the procedure that I have provided informed consent.    Procedure:   GXT    Indication/Reason:   Decreased functional capacity/endurance   EXERCISE TOLERANCE TEST (ETT)    Standing Status:   Future    Expected Date:   01/31/2024    Expiration Date:   07/23/2024    Where should this test be performed:   [facility] [address] [facility]    Stress with pharmacologic or treadmill ?:   Treadmill w/ exercise    Is patient able to ambulate on a treadmill?:   Yes   EKG 12-Lead   ECHOCARDIOGRAM COMPLETE    Standing Status:   Future    Expected Date:   01/31/2024    Expiration Date:   01/23/2025    [facility]    Does the patient weigh less than or greater than 250 lbs?:   Patient weighs less than 250 lbs    Perflutren DEFINITY (image enhancing agent) should be administered unless hypersensitivity or allergy exist:   Administer Perflutren    Reason for exam-Echo:   Dyspnea  R06.00     Final Medication List:    Meds ordered this encounter  Medications   rosuvastatin  (CRESTOR ) 10 MG tablet    Sig: Take 1 tablet (10 mg total) by mouth at bedtime.    Dispense:  90 tablet    Refill:  3    Medications Discontinued  During This Encounter  Medication Reason   ACTEMRA  ACTPEN 162 MG/0.9ML SOAJ Patient Preference     Current Outpatient Medications:    rosuvastatin  (CRESTOR ) 10 MG tablet, Take 1 tablet (10 mg total) by mouth at bedtime., Disp: 90 tablet, Rfl: 3  Consent:   Informed Consent   Shared Decision Making/Informed Consent The risks [chest pain, shortness of breath, cardiac arrhythmias, dizziness, blood pressure fluctuations, myocardial infarction, stroke/transient ischemic attack, and life-threatening complications (estimated to be 1 in 10,000)], benefits (risk stratification, diagnosing coronary artery disease, treatment guidance) and alternatives of an exercise tolerance test were discussed in detail with Ms. Tow and she agrees to proceed.     Disposition:   3 months sooner if needed  Her questions and concerns were addressed to her satisfaction. She voices understanding of the recommendations provided during this encounter.    Signed, Madonna Michele HAS, [facility]  A Division of [facility] [address], [place], [place] [address]  01/24/2024 7:31 PM

## 2024-01-24 NOTE — Patient Instructions (Addendum)
 Medication Instructions:  Please start Crestor  10 mg every evening. Continue all other medications as listed.  *If you need a refill on your cardiac medications before your next appointment, please call your pharmacy*  Lab Work: Please have fasting Lipid and CMP in 3 months.  If you have labs (blood work) drawn today and your tests are completely normal, you will receive your results only by: MyChart Message (if you have MyChart) OR A paper copy in the mail If you have any lab test that is abnormal or we need to change your treatment, we will call you to review the results.  Testing/Procedures: Your physician has requested that you have an echocardiogram. Echocardiography is a painless test that uses sound waves to create images of your heart. It provides your doctor with information about the size and shape of your heart and how well your heart's chambers and valves are working. This procedure takes approximately one hour. There are no restrictions for this procedure. Please do NOT wear cologne, perfume, aftershave, or lotions (deodorant is allowed). Please arrive 15 minutes prior to your appointment time.  Please note: We ask at that you not bring children with you during ultrasound (echo/ vascular) testing. Due to room size and safety concerns, children are not allowed in the ultrasound rooms during exams. Our front office staff cannot provide observation of children in our lobby area while testing is being conducted. An adult accompanying a patient to their appointment will only be allowed in the ultrasound room at the discretion of the ultrasound technician under special circumstances. We apologize for any inconvenience.  Your physician has requested that you have an exercise tolerance test.  Nothing to eat/drink 3 hours before the test.  Come in 2 piece comfortable clothing and walking shoes.  Follow-Up: At [facility], you and your health needs are our priority.  As part of  our continuing mission to provide you with exceptional heart care, our providers are all part of one team.  This team includes your primary Cardiologist (physician) and Advanced Practice Providers or APPs (Physician Assistants and Nurse Practitioners) who all work together to provide you with the care you need, when you need it.  Your next appointment:   3 month(s)  Provider:   Madonna Large, DO    We recommend signing up for the patient portal called MyChart.  Sign up information is provided on this After Visit Summary.  MyChart is used to connect with patients for Virtual Visits (Telemedicine).  Patients are able to view lab/test results, encounter notes, upcoming appointments, etc.  Non-urgent messages can be sent to your provider as well.   To learn more about what you can do with MyChart, go to [email or web address].

## 2024-01-30 ENCOUNTER — Ambulatory Visit: Payer: Self-pay | Admitting: Cardiology

## 2024-02-07 ENCOUNTER — Encounter: Payer: Self-pay | Admitting: Neurology

## 2024-02-07 ENCOUNTER — Ambulatory Visit: Admitting: Neurology

## 2024-02-07 VITALS — BP 104/72 | HR 79 | Ht 61.0 in | Wt 141.6 lb

## 2024-02-07 DIAGNOSIS — R200 Anesthesia of skin: Secondary | ICD-10-CM

## 2024-02-07 DIAGNOSIS — M62838 Other muscle spasm: Secondary | ICD-10-CM | POA: Diagnosis not present

## 2024-02-07 DIAGNOSIS — G379 Demyelinating disease of central nervous system, unspecified: Secondary | ICD-10-CM | POA: Diagnosis not present

## 2024-02-07 DIAGNOSIS — G3781 Myelin oligodendrocyte glycoprotein antibody disease: Secondary | ICD-10-CM

## 2024-02-07 DIAGNOSIS — R42 Dizziness and giddiness: Secondary | ICD-10-CM
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
## 2024-02-28 ENCOUNTER — Encounter: Payer: Self-pay | Admitting: Neurology

## 2024-02-28 NOTE — Telephone Encounter (Signed)
 Per previous notes:    I called [facility] at [phone]. Spoke w/ Eric. Transferred to pharmacist/Greg. Provided VO for Actemra  (ACTPen) 162mg , qty 10.46ml, 3 refills. Provided pt phone#[phone]. They will get order ready for pt, nothing further needed. Pt last seen 02/07/24, next appt scheduled for 06/07/24.

## 2024-03-01 ENCOUNTER — Encounter (HOSPITAL_COMMUNITY): Payer: Self-pay | Admitting: *Deleted

## 2024-03-09 ENCOUNTER — Ambulatory Visit (HOSPITAL_COMMUNITY)

## 2024-04-17 ENCOUNTER — Ambulatory Visit: Attending: Cardiology | Admitting: Cardiology

## 2024-06-07 ENCOUNTER — Ambulatory Visit: Admitting: Neurology

## 2024-08-07 ENCOUNTER — Ambulatory Visit: Admitting: Neurology
